# Patient Record
Sex: Female | Born: 1985 | Race: White | Hispanic: No | Marital: Married | Smoking: Current every day smoker
Health system: Southern US, Community
[De-identification: ages and names within clinical notes are randomized; demographics above are authoritative.]

## PROBLEM LIST (undated history)

## (undated) DIAGNOSIS — F32A Depression, unspecified: Secondary | ICD-10-CM

## (undated) DIAGNOSIS — F419 Anxiety disorder, unspecified: Secondary | ICD-10-CM

## (undated) DIAGNOSIS — E119 Type 2 diabetes mellitus without complications: Secondary | ICD-10-CM

## (undated) DIAGNOSIS — Z803 Family history of malignant neoplasm of breast: Secondary | ICD-10-CM

## (undated) DIAGNOSIS — M5430 Sciatica, unspecified side: Secondary | ICD-10-CM

## (undated) DIAGNOSIS — R569 Unspecified convulsions: Secondary | ICD-10-CM

## (undated) DIAGNOSIS — J45909 Unspecified asthma, uncomplicated: Secondary | ICD-10-CM

## (undated) DIAGNOSIS — F329 Major depressive disorder, single episode, unspecified: Secondary | ICD-10-CM

## (undated) HISTORY — DX: Depression, unspecified: F32.A

## (undated) HISTORY — DX: Unspecified convulsions: R56.9

## (undated) HISTORY — DX: Anxiety disorder, unspecified: F41.9

## (undated) HISTORY — DX: Sciatica, unspecified side: M54.30

## (undated) HISTORY — PX: WISDOM TOOTH EXTRACTION: SHX21

## (undated) HISTORY — DX: Major depressive disorder, single episode, unspecified: F32.9

## (undated) HISTORY — DX: Family history of malignant neoplasm of breast: Z80.3

---
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
## 2008-11-30 ENCOUNTER — Emergency Department: Payer: Self-pay | Admitting: Emergency Medicine

## 2009-01-13 ENCOUNTER — Emergency Department: Payer: Self-pay | Admitting: Emergency Medicine

## 2009-01-15 ENCOUNTER — Emergency Department: Payer: Self-pay | Admitting: Emergency Medicine

## 2009-02-21 ENCOUNTER — Emergency Department: Payer: Self-pay | Admitting: Emergency Medicine

## 2009-05-10 ENCOUNTER — Emergency Department: Payer: Self-pay | Admitting: Emergency Medicine

## 2010-06-09 ENCOUNTER — Emergency Department: Payer: Self-pay | Admitting: Emergency Medicine

## 2010-12-23 ENCOUNTER — Emergency Department: Payer: Self-pay | Admitting: Emergency Medicine

## 2013-04-04 LAB — COMPREHENSIVE METABOLIC PANEL
ALBUMIN: 3.9 g/dL (ref 3.4–5.0)
ALT: 12 U/L (ref 12–78)
ANION GAP: 6 — AB (ref 7–16)
Alkaline Phosphatase: 88 U/L
BUN: 11 mg/dL (ref 7–18)
Bilirubin,Total: 0.2 mg/dL (ref 0.2–1.0)
CHLORIDE: 108 mmol/L — AB (ref 98–107)
CREATININE: 0.76 mg/dL (ref 0.60–1.30)
Calcium, Total: 9 mg/dL (ref 8.5–10.1)
Co2: 21 mmol/L (ref 21–32)
EGFR (African American): >60
EGFR (Non-African Amer.): >60
Glucose: 88 mg/dL (ref 65–99)
OSMOLALITY: 269 (ref 275–301)
Potassium: 4 mmol/L (ref 3.5–5.1)
SGOT(AST): 27 U/L (ref 15–37)
Sodium: 135 mmol/L — ABNORMAL LOW (ref 136–145)
Total Protein: 7.4 g/dL (ref 6.4–8.2)

## 2013-04-04 LAB — SALICYLATE LEVEL: SALICYLATES, SERUM: 3.2 mg/dL — AB

## 2013-04-04 LAB — DRUG SCREEN, URINE

## 2013-04-04 LAB — CBC
HCT: 39.3 % (ref 35.0–47.0)
HGB: 13.3 g/dL (ref 12.0–16.0)
MCH: 29 pg (ref 26.0–34.0)
MCHC: 33.9 g/dL (ref 32.0–36.0)
MCV: 86 fL (ref 80–100)
Platelet: 249 10*3/uL (ref 150–440)
RBC: 4.6 10*6/uL (ref 3.80–5.20)
RDW: 13.5 % (ref 11.5–14.5)
WBC: 17.3 10*3/uL — AB (ref 3.6–11.0)

## 2013-04-04 LAB — URINALYSIS, COMPLETE
Bilirubin,UR: NEGATIVE
Blood: NEGATIVE
GLUCOSE, UR: NEGATIVE mg/dL (ref 0–75)
Ketone: NEGATIVE
Leukocyte Esterase: NEGATIVE
NITRITE: NEGATIVE
PH: 5 (ref 4.5–8.0)
Protein: NEGATIVE
RBC,UR: <1 /HPF (ref 0–5)
Specific Gravity: 1.008 (ref 1.003–1.030)
Squamous Epithelial: <1

## 2013-04-04 LAB — ETHANOL
Ethanol %: <0.003 % (ref 0.000–0.080)
Ethanol: <3 mg/dL

## 2013-04-04 LAB — ACETAMINOPHEN LEVEL: Acetaminophen: <2 ug/mL

## 2013-04-05 ENCOUNTER — Inpatient Hospital Stay: Payer: Self-pay | Admitting: Psychiatry

## 2013-04-05 LAB — PREGNANCY, URINE: PREGNANCY TEST, URINE: NEGATIVE m[IU]/mL

## 2013-04-15 ENCOUNTER — Emergency Department: Payer: Self-pay | Admitting: Emergency Medicine

## 2013-04-15 LAB — BASIC METABOLIC PANEL
Anion Gap: 5 — ABNORMAL LOW (ref 7–16)
BUN: 5 mg/dL — AB (ref 7–18)
CREATININE: 0.81 mg/dL (ref 0.60–1.30)
Calcium, Total: 9.7 mg/dL (ref 8.5–10.1)
Chloride: 109 mmol/L — ABNORMAL HIGH (ref 98–107)
Co2: 27 mmol/L (ref 21–32)
EGFR (Non-African Amer.): >60
Glucose: 94 mg/dL (ref 65–99)
OSMOLALITY: 278 (ref 275–301)
POTASSIUM: 4 mmol/L (ref 3.5–5.1)
Sodium: 141 mmol/L (ref 136–145)

## 2013-04-15 LAB — URINALYSIS, COMPLETE
BLOOD: NEGATIVE
Bilirubin,UR: NEGATIVE
Glucose,UR: NEGATIVE mg/dL (ref 0–75)
Ketone: NEGATIVE
Leukocyte Esterase: NEGATIVE
Nitrite: NEGATIVE
Ph: 7 (ref 4.5–8.0)
Protein: NEGATIVE
RBC, UR: NONE SEEN /HPF (ref 0–5)
SQUAMOUS EPITHELIAL: NONE SEEN
Specific Gravity: 1.003 (ref 1.003–1.030)
WBC UR: NONE SEEN /HPF (ref 0–5)

## 2013-04-15 LAB — CBC WITH DIFFERENTIAL/PLATELET
Basophil #: 0.1 10*3/uL (ref 0.0–0.1)
Basophil %: 0.7 %
Eosinophil #: 0.1 10*3/uL (ref 0.0–0.7)
Eosinophil %: 0.3 %
HCT: 44.5 % (ref 35.0–47.0)
HGB: 14.9 g/dL (ref 12.0–16.0)
Lymphocyte #: 2.9 10*3/uL (ref 1.0–3.6)
Lymphocyte %: 17.3 %
MCH: 28.7 pg (ref 26.0–34.0)
MCHC: 33.4 g/dL (ref 32.0–36.0)
MCV: 86 fL (ref 80–100)
Monocyte #: 0.7 x10 3/mm (ref 0.2–0.9)
Monocyte %: 3.9 %
Neutrophil #: 13.1 10*3/uL — ABNORMAL HIGH (ref 1.4–6.5)
Neutrophil %: 77.8 %
Platelet: 343 10*3/uL (ref 150–440)
RBC: 5.19 10*6/uL (ref 3.80–5.20)
RDW: 13.5 % (ref 11.5–14.5)
WBC: 16.8 10*3/uL — AB (ref 3.6–11.0)

## 2013-06-25 ENCOUNTER — Emergency Department: Payer: Self-pay | Admitting: Emergency Medicine

## 2013-09-16 ENCOUNTER — Emergency Department: Payer: Self-pay | Admitting: Emergency Medicine

## 2014-03-13 ENCOUNTER — Emergency Department: Payer: Self-pay | Admitting: Emergency Medicine

## 2014-03-13 LAB — COMPREHENSIVE METABOLIC PANEL
ALK PHOS: 59 U/L
Albumin: 4.1 g/dL (ref 3.4–5.0)
Anion Gap: 9 (ref 7–16)
BILIRUBIN TOTAL: 0.5 mg/dL (ref 0.2–1.0)
BUN: 11 mg/dL (ref 7–18)
CHLORIDE: 109 mmol/L — AB (ref 98–107)
CREATININE: 0.68 mg/dL (ref 0.60–1.30)
Calcium, Total: 8.9 mg/dL (ref 8.5–10.1)
Co2: 21 mmol/L (ref 21–32)
EGFR (African American): >60
EGFR (Non-African Amer.): >60
Glucose: 79 mg/dL (ref 65–99)
OSMOLALITY: 276 (ref 275–301)
Potassium: 3.8 mmol/L (ref 3.5–5.1)
SGOT(AST): 23 U/L (ref 15–37)
SGPT (ALT): 19 U/L
SODIUM: 139 mmol/L (ref 136–145)
Total Protein: 7.4 g/dL (ref 6.4–8.2)

## 2014-03-13 LAB — CBC
HCT: 41.8 % (ref 35.0–47.0)
HGB: 13.8 g/dL (ref 12.0–16.0)
MCH: 29.3 pg (ref 26.0–34.0)
MCHC: 33 g/dL (ref 32.0–36.0)
MCV: 89 fL (ref 80–100)
Platelet: 269 10*3/uL (ref 150–440)
RBC: 4.71 10*6/uL (ref 3.80–5.20)
RDW: 13.3 % (ref 11.5–14.5)
WBC: 10.9 10*3/uL (ref 3.6–11.0)

## 2014-05-09 DIAGNOSIS — R569 Unspecified convulsions: Secondary | ICD-10-CM | POA: Insufficient documentation
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
## 2014-06-24 NOTE — Discharge Summary (Signed)
PATIENT NAME:  Jenna Huber, Jenna Huber MR#:  [id number] DATE OF BIRTH:  April 23, 1985  DATE OF ADMISSION:  04/05/2013 DATE OF DISCHARGE:  04/07/2013  HOSPITAL COURSE: See dictated history and physical for details of admission. This 29 year old woman came into the Emergency Room after taking an overdose of ibuprofen. She was denying that it was suicidal, although she was agitated and had some vague suicidal thoughts when she first got in. She was very upset and agitated and out of control in the Emergency Room. When she got to the ward, she settled down quite a bit. Did not appear to be acutely psychotic. The patient appears to have an acute adjustment disorder, probably compounded by chronic dysthymia and anxiety and personality disorder features under severe stress. She was advised to try starting a serotonin reuptake inhibitor and has started on fluoxetine. Tolerated medicine well without any side effects. Did not engage in any dangerous behavior in the hospital. Insight and judgment improved. Participated in groups appropriately. Had a good visit with her family and now says that she plans not to go back to her wife right now. Thinks she will get more supportive care staying with her family. She is lucid with no signs of psychosis. Mental status exam back to normal. She is fully agreeable to outpatient treatment in the community. She will be discharged today with the plan to go back and stay with her family.   LABORATORY RESULTS: Admission labs include chemistry panel with an alcohol level negative, sodium low at 135, chloride elevated at 108. Drug screen negative. CBC: Elevated white count at 17.3, otherwise normal. Urinalysis 1+ bacteria but otherwise does not appear to be infected. Pregnancy test negative.   DISCHARGE MEDICATIONS: Fluoxetine 20 mg p.o. daily.   DISPOSITION: Discharge home with her family. [facility]   MENTAL STATUS EXAM AT DISCHARGE: Neatly dressed and groomed woman who looks her  stated age, cooperative with the interview. Good eye contact. Normal psychomotor activity. Speech normal rate, tone and volume. Affect euthymic, reactive, appropriate. Mood stated as being better. Thoughts are lucid. No loosening of associations. Denies auditory or visual hallucinations. Denied suicidal or homicidal ideation. Shows improved insight and judgment. Alert and oriented x 4. Short- and long-term memory grossly intact. Normal intelligence. Good judgment and insight.   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   SECONDARY DIAGNOSES:  AXIS I: Dysthymia.  AXIS II: Borderline personality disorder.  AXIS III: No diagnosis.  AXIS IV: Severe from break up with her wife.  AXIS V: Functioning at time of discharge 52.    ____________________________ Audery AmelJohn T. Jermain Curt, MD jtc:gb D: 04/07/2013 23:18:05 ET T: 04/08/2013 00:29:59 ET JOB#: [id number]  cc: Audery AmelJohn T. Sebrina Kessner, MD, <Dictator> Audery AmelJOHN T Jaspreet Bodner MD ELECTRONICALLY SIGNED 04/08/2013 10:21

## 2014-08-11 ENCOUNTER — Emergency Department
Admission: EM | Admit: 2014-08-11 | Discharge: 2014-08-11 | Disposition: A | Discharge status: Home / Self Care | Payer: Managed Care, Other (non HMO) | Attending: Emergency Medicine | Admitting: Emergency Medicine

## 2014-08-11 ENCOUNTER — Encounter: Payer: Self-pay | Admitting: Emergency Medicine

## 2014-08-11 ENCOUNTER — Emergency Department: Payer: Managed Care, Other (non HMO)

## 2014-08-11 DIAGNOSIS — M5441 Lumbago with sciatica, right side: Secondary | ICD-10-CM | POA: Diagnosis not present

## 2014-08-11 DIAGNOSIS — Z720 Tobacco use: Secondary | ICD-10-CM | POA: Diagnosis not present

## 2014-08-11 DIAGNOSIS — G8929 Other chronic pain: Secondary | ICD-10-CM | POA: Insufficient documentation

## 2014-08-11 DIAGNOSIS — M545 Low back pain, unspecified: Secondary | ICD-10-CM

## 2014-08-11 DIAGNOSIS — Z880 Allergy status to penicillin: Secondary | ICD-10-CM | POA: Insufficient documentation

## 2014-08-11 MED ORDER — PREDNISONE 10 MG PO TABS: ORAL_TABLET | ORAL | Status: DC

## 2014-08-11 NOTE — ED Notes (Signed)
Chronic back pain with ongoing sciatica, thinks she might have a cyst in her lower back as well.  Coming today because usually her back pain is managed well but lately it has been very painful.

## 2014-08-11 NOTE — ED Notes (Signed)
Has shooting pains down right leg, no loss of bowel or bladder control.  States now the pain is shooting up her back and this is something she is not used to.
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
## 2014-08-11 NOTE — Discharge Instructions (Signed)
Back Pain, Adult °Back pain is very common. The pain often gets better over time. The cause of back pain is usually not dangerous. Most people can learn to manage their back pain on their own.  °HOME CARE  °· Stay active. Start with short walks on flat ground if you can. Try to walk farther each day. °· Do not sit, drive, or stand in one place for more than 30 minutes. Do not stay in bed. °· Do not avoid exercise or work. Activity can help your back heal faster. °· Be careful when you bend or lift an object. Bend at your knees, keep the object close to you, and do not twist. °· Sleep on a firm mattress. Lie on your side, and bend your knees. If you lie on your back, put a pillow under your knees. °· Only take medicines as told by your doctor. °· Put ice on the injured area. °¨ Put ice in a plastic bag. °¨ Place a towel between your skin and the bag. °¨ Leave the ice on for 15-20 minutes, 03-04 times a day for the first 2 to 3 days. After that, you can switch between ice and heat packs. °· Ask your doctor about back exercises or massage. °· Avoid feeling anxious or stressed. Find good ways to deal with stress, such as exercise. °GET HELP RIGHT AWAY IF:  °· Your pain does not go away with rest or medicine. °· Your pain does not go away in 1 week. °· You have new problems. °· You do not feel well. °· The pain spreads into your legs. °· You cannot control when you poop (bowel movement) or pee (urinate). °· Your arms or legs feel weak or lose feeling (numbness). °· You feel sick to your stomach (nauseous) or throw up (vomit). °· You have belly (abdominal) pain. °· You feel like you may pass out (faint). °MAKE SURE YOU:  °· Understand these instructions. °· Will watch your condition. °· Will get help right away if you are not doing well or get worse. °Document Released: 08/06/2007 Document Revised: 05/12/2011 Document Reviewed: 06/21/2013 °[employer]® Patient Information ©2015 [employer]. This information is not intended  to replace advice given to you by your health care provider. Make sure you discuss any questions you have with your health care provider. ° °

## 2014-08-14 DIAGNOSIS — G43009 Migraine without aura, not intractable, without status migrainosus: Secondary | ICD-10-CM | POA: Insufficient documentation

## 2014-11-21 ENCOUNTER — Other Ambulatory Visit: Payer: Managed Care, Other (non HMO) | Admitting: Physical Medicine and Rehabilitation

## 2014-11-24 ENCOUNTER — Other Ambulatory Visit: Payer: Self-pay | Admitting: Physical Medicine and Rehabilitation

## 2014-11-24 DIAGNOSIS — M545 Low back pain, unspecified: Secondary | ICD-10-CM

## 2014-11-24 DIAGNOSIS — M79604 Pain in right leg: Secondary | ICD-10-CM

## 2014-12-05 ENCOUNTER — Ambulatory Visit
Admission: RE | Admit: 2014-12-05 | Discharge: 2014-12-05 | Disposition: A | Discharge status: Home / Self Care | Payer: Managed Care, Other (non HMO) | Source: Ambulatory Visit | Attending: Physical Medicine and Rehabilitation | Admitting: Physical Medicine and Rehabilitation

## 2014-12-05 DIAGNOSIS — M5136 Other intervertebral disc degeneration, lumbar region: Secondary | ICD-10-CM | POA: Insufficient documentation

## 2014-12-05 DIAGNOSIS — M545 Low back pain, unspecified: Secondary | ICD-10-CM

## 2014-12-05 DIAGNOSIS — M79604 Pain in right leg: Secondary | ICD-10-CM | POA: Diagnosis present

## 2015-05-31 ENCOUNTER — Other Ambulatory Visit: Payer: Self-pay | Admitting: Neurology

## 2015-05-31 DIAGNOSIS — R42 Dizziness and giddiness: Secondary | ICD-10-CM

## 2015-06-21 ENCOUNTER — Ambulatory Visit
Admission: RE | Admit: 2015-06-21 | Discharge: 2015-06-21 | Disposition: A | Discharge status: Home / Self Care | Payer: Managed Care, Other (non HMO) | Source: Ambulatory Visit | Attending: Neurology | Admitting: Neurology

## 2015-06-21 DIAGNOSIS — R42 Dizziness and giddiness: Secondary | ICD-10-CM | POA: Diagnosis present

## 2015-06-21 DIAGNOSIS — G43119 Migraine with aura, intractable, without status migrainosus: Secondary | ICD-10-CM | POA: Diagnosis present

## 2015-06-21 MED ORDER — GADOBENATE DIMEGLUMINE 529 MG/ML IV SOLN
15.0000 mL | Freq: Once | INTRAVENOUS | Status: AC | PRN
Start: 2015-06-21 — End: 2015-06-21
  Administered 2015-06-21: 13 mL via INTRAVENOUS

## 2015-08-29 ENCOUNTER — Emergency Department
Admission: EM | Admit: 2015-08-29 | Discharge: 2015-08-29 | Disposition: A | Discharge status: Home / Self Care | Payer: Managed Care, Other (non HMO) | Attending: Emergency Medicine | Admitting: Emergency Medicine

## 2015-08-29 DIAGNOSIS — J019 Acute sinusitis, unspecified: Secondary | ICD-10-CM | POA: Diagnosis not present

## 2015-08-29 DIAGNOSIS — Z7952 Long term (current) use of systemic steroids: Secondary | ICD-10-CM | POA: Diagnosis not present

## 2015-08-29 DIAGNOSIS — F17210 Nicotine dependence, cigarettes, uncomplicated: Secondary | ICD-10-CM | POA: Insufficient documentation

## 2015-08-29 DIAGNOSIS — T700XXA Otitic barotrauma, initial encounter: Secondary | ICD-10-CM

## 2015-08-29 DIAGNOSIS — R0981 Nasal congestion: Secondary | ICD-10-CM | POA: Diagnosis present

## 2015-08-29 MED ORDER — METHYLPREDNISOLONE 4 MG PO TBPK: ORAL_TABLET | ORAL | Status: DC

## 2015-08-29 MED ORDER — OXYCODONE-ACETAMINOPHEN 5-325 MG PO TABS
0.5000 | ORAL_TABLET | Freq: Once | ORAL | Status: AC
  Administered 2015-08-29: 0.5 via ORAL
  Filled 2015-08-29: qty 1

## 2015-08-29 MED ORDER — LEVOFLOXACIN 750 MG PO TABS: 750.0000 mg | ORAL_TABLET | Freq: Every day | ORAL | Status: DC

## 2015-08-29 MED ORDER — LEVOFLOXACIN 750 MG PO TABS
750.0000 mg | ORAL_TABLET | Freq: Once | ORAL | Status: AC
  Administered 2015-08-29: 750 mg via ORAL
  Filled 2015-08-29: qty 1
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
## 2015-08-29 NOTE — Discharge Instructions (Signed)
1. Take antibiotic as prescribed (Levaquin 750 mg daily 6 days). 2. Take steroid as prescribed (Medrol Dosepak). 3. Return to the ER for worsening symptoms, persistent vomiting, fever, difficulty breathing or other concerns.  Barotitis Media Barotitis media is inflammation of your middle ear. This occurs when the auditory tube (eustachian tube) leading from the back of your nose (nasopharynx) to your eardrum is blocked. This blockage may result from a cold, environmental allergies, or an upper respiratory infection. Unresolved barotitis media may lead to damage or hearing loss (barotrauma), which may become permanent. HOME CARE INSTRUCTIONS   Use medicines as recommended by your health care provider. Over-the-counter medicines will help unblock the canal and can help during times of air travel.  Do not put anything into your ears to clean or unplug them. Eardrops will not be helpful.  Do not swim, dive, or fly until your health care provider says it is all right to do so. If these activities are necessary, chewing gum with frequent, forceful swallowing may help. It is also helpful to hold your nose and gently blow to pop your ears for equalizing pressure changes. This forces air into the eustachian tube.  Only take over-the-counter or prescription medicines for pain, discomfort, or fever as directed by your health care provider.  A decongestant may be helpful in decongesting the middle ear and make pressure equalization easier. SEEK MEDICAL CARE IF:  You experience a serious form of dizziness in which you feel as if the room is spinning and you feel nauseated (vertigo).  Your symptoms only involve one ear. SEEK IMMEDIATE MEDICAL CARE IF:   You develop a severe headache, dizziness, or severe ear pain.  You have bloody or pus-like drainage from your ears.  You develop a fever.  Your problems do not improve or become worse. MAKE SURE YOU:   Understand these instructions.  Will watch  your condition.  Will get help right away if you are not doing well or get worse.   This information is not intended to replace advice given to you by your health care provider. Make sure you discuss any questions you have with your health care provider.   Document Released: 02/15/2000 Document Revised: 12/08/2012 Document Reviewed: 09/14/2012 Elsevier Interactive Patient Education 2016 [employer]  Sinusitis, Adult Sinusitis is redness, soreness, and puffiness (inflammation) of the air pockets in the bones of your face (sinuses). The redness, soreness, and puffiness can cause air and mucus to get trapped in your sinuses. This can allow germs to grow and cause an infection.  HOME CARE   Drink enough fluids to keep your pee (urine) clear or pale yellow.  Use a humidifier in your home.  Run a hot shower to create steam in the bathroom. Sit in the bathroom with the door closed. Breathe in the steam 3-4 times a day.  Put a warm, moist washcloth on your face 3-4 times a day, or as told by your doctor.  Use salt water sprays (saline sprays) to wet the thick fluid in your nose. This can help the sinuses drain.  Only take medicine as told by your doctor. GET HELP RIGHT AWAY IF:   Your pain gets worse.  You have very bad headaches.  You are sick to your stomach (nauseous).  You throw up (vomit).  You are very sleepy (drowsy) all the time.  Your face is puffy (swollen).  Your vision changes.  You have a stiff neck.  You have trouble breathing. MAKE SURE YOU:  Understand these instructions.  Will watch your condition.  Will get help right away if you are not doing well or get worse.   This information is not intended to replace advice given to you by your health care provider. Make sure you discuss any questions you have with your health care provider.   Document Released: 08/06/2007 Document Revised: 03/10/2014 Document Reviewed: 09/23/2011 Elsevier Interactive Patient  Education [employer] Inc[employer]

## 2015-08-29 NOTE — ED Notes (Signed)
Pt in with co right sided facial swelling and pressure. Has had congestion and right sided earache for a week.

## 2015-08-29 NOTE — ED Provider Notes (Signed)
[facility] Emergency Department Provider Note   ____________________________________________  Time seen: Approximately 4:52 AM  I have reviewed the triage vital signs and the nursing notes.   HISTORY  Chief Complaint Facial Swelling    HPI Jenna Huber is a [age] y.o. female who presents to the ED from home with a chief complaint of sinus congestion/pressure, right ear pain/fullness and right sided facial pressure; feels like the right side of her face and nasal bridge or swollen. Symptoms ongoing x 1 week. Denies associated fever, chills, cough, congestion, chest pain, shortness of breath, abdominal pain, nausea, vomiting, diarrhea. Denies recent travel or trauma. Nothing makes her symptoms better or worse.   Past medical history None  There are no active problems to display for this patient.   Past Surgical History  Procedure Laterality Date  . Wisdom tooth extraction      Current Outpatient Rx  Name  Route  Sig  Dispense  Refill  . levofloxacin (LEVAQUIN) 750 MG tablet   Oral   Take 1 tablet (750 mg total) by mouth daily.   6 tablet   0   . methylPREDNISolone (MEDROL DOSEPAK) 4 MG TBPK tablet      Take as directed   21 tablet   0   . predniSONE (DELTASONE) 10 MG tablet      Take 6 tablets  today, on day 2 take 5 tablets, day 3 take 4 tablets, day 4 take 3 tablets, day 5 take  2 tablets and 1 tablet the last day   21 tablet   0     Allergies Penicillins and Vicodin  No family history on file.  Social History Social History  Substance Use Topics  . Smoking status: Current Every Day Smoker -- 0.50 packs/day for 3 years    Types: Cigarettes  . Smokeless tobacco: Not on file  . Alcohol Use: Yes     Comment: rarely    Review of Systems  Constitutional: No fever/chills. Eyes: No visual changes. ENT: Positive for sinus pressure, right ear discomfort and nasal congestion. No sore throat. Cardiovascular: Denies chest  pain. Respiratory: Denies shortness of breath. Gastrointestinal: No abdominal pain.  No nausea, no vomiting.  No diarrhea.  No constipation. Genitourinary: Negative for dysuria. Musculoskeletal: Negative for back pain. Skin: Negative for rash. Neurological: Negative for headaches, focal weakness or numbness.  10-point ROS otherwise negative.  ____________________________________________   PHYSICAL EXAM:  VITAL SIGNS: ED Triage Vitals  Enc Vitals Group     BP 08/29/15 0203 117/65 mmHg     Pulse Rate 08/29/15 0203 103     Resp 08/29/15 0203 18     Temp 08/29/15 0203 98.1 F (36.7 C)     Temp Source 08/29/15 0203 Oral     SpO2 08/29/15 0203 100 %     Weight 08/29/15 0203 145 lb (65.772 kg)     Height 08/29/15 0203 5\' 4"  (1.626 m)     Head Cir --      Peak Flow --      Pain Score 08/29/15 0203 10     Pain Loc --      Pain Edu? --      Excl. in GC? --     Constitutional: Alert and oriented. Well appearing and in no acute distress. Eyes: Conjunctivae are normal. PERRL. EOMI. Head: Atraumatic. No visible facial swelling. Frontal maxillary sinuses tender to palpation. Ears: Right TM with fluid. Left TM within normal limits. Nose: Congestion/rhinnorhea.  Mouth/Throat: Mucous membranes are moist.  Oropharynx non-erythematous.  Postnasal drip. Neck: No stridor.   Hematological/Lymphatic/Immunilogical: No cervical lymphadenopathy. Cardiovascular: Normal rate, regular rhythm. Grossly normal heart sounds.  Good peripheral circulation. Respiratory: Normal respiratory effort.  No retractions. Lungs CTAB. Gastrointestinal: Soft and nontender. No distention. No abdominal bruits. No CVA tenderness. Musculoskeletal: No lower extremity tenderness nor edema.  No joint effusions. Neurologic:  Normal speech and language. No gross focal neurologic deficits are appreciated. No gait instability. Skin:  Skin is warm, dry and intact. No rash noted. Psychiatric: Mood and affect are normal. Speech  and behavior are normal.  ____________________________________________   LABS (all labs ordered are listed, but only abnormal results are displayed)  Labs Reviewed - No data to display ____________________________________________  EKG  None ____________________________________________  RADIOLOGY  None ____________________________________________   PROCEDURES  Procedure(s) performed: None  Critical Care performed: No  ____________________________________________   INITIAL IMPRESSION / ASSESSMENT AND PLAN / ED COURSE  Pertinent labs & imaging results that were available during my care of the patient were reviewed by me and considered in my medical decision making (see chart for details).  30 year old female who presents with sinus pressure and right ear discomfort consistent with sinusitis with Eustachian tube dysfunction. Will treat with Levaquin, Medrol Dosepak and patient will follow-up with her PCP next week. Strict return precautions given. Patient verbalizes understanding and agrees with plan of care. ____________________________________________   FINAL CLINICAL IMPRESSION(S) / ED DIAGNOSES  Final diagnoses:  Acute sinusitis, recurrence not specified, unspecified location  Barotitis media, initial encounter      NEW MEDICATIONS STARTED DURING THIS VISIT:  New Prescriptions   LEVOFLOXACIN (LEVAQUIN) 750 MG TABLET    Take 1 tablet (750 mg total) by mouth daily.   METHYLPREDNISOLONE (MEDROL DOSEPAK) 4 MG TBPK TABLET    Take as directed     Note:  This document was prepared using Dragon voice recognition software and may include unintentional dictation errors.    Irean HongJade J Damali Broadfoot, MD 08/29/15 [id number]

## 2015-10-12 ENCOUNTER — Encounter: Payer: Self-pay | Admitting: Emergency Medicine

## 2015-10-12 ENCOUNTER — Emergency Department
Admission: EM | Admit: 2015-10-12 | Discharge: 2015-10-12 | Disposition: A | Discharge status: Home / Self Care | Payer: Managed Care, Other (non HMO) | Attending: Emergency Medicine | Admitting: Emergency Medicine

## 2015-10-12 ENCOUNTER — Emergency Department: Payer: Managed Care, Other (non HMO)

## 2015-10-12 DIAGNOSIS — N83209 Unspecified ovarian cyst, unspecified side: Secondary | ICD-10-CM | POA: Insufficient documentation

## 2015-10-12 DIAGNOSIS — N760 Acute vaginitis: Secondary | ICD-10-CM | POA: Insufficient documentation

## 2015-10-12 DIAGNOSIS — R1031 Right lower quadrant pain: Secondary | ICD-10-CM | POA: Diagnosis present

## 2015-10-12 DIAGNOSIS — Z7951 Long term (current) use of inhaled steroids: Secondary | ICD-10-CM | POA: Diagnosis not present

## 2015-10-12 DIAGNOSIS — F17210 Nicotine dependence, cigarettes, uncomplicated: Secondary | ICD-10-CM | POA: Diagnosis not present

## 2015-10-12 DIAGNOSIS — Z7982 Long term (current) use of aspirin: Secondary | ICD-10-CM | POA: Insufficient documentation

## 2015-10-12 DIAGNOSIS — B9689 Other specified bacterial agents as the cause of diseases classified elsewhere: Secondary | ICD-10-CM

## 2015-10-12 LAB — POCT PREGNANCY, URINE: Preg Test, Ur: NEGATIVE

## 2015-10-12 LAB — URINALYSIS COMPLETE WITH MICROSCOPIC (ARMC ONLY)
Bacteria, UA: NONE SEEN
Bilirubin Urine: NEGATIVE
Glucose, UA: NEGATIVE mg/dL
LEUKOCYTES UA: NEGATIVE
NITRITE: NEGATIVE
PH: 5 (ref 5.0–8.0)
PROTEIN: NEGATIVE mg/dL
RBC / HPF: NONE SEEN RBC/hpf (ref 0–5)
SPECIFIC GRAVITY, URINE: 1.011 (ref 1.005–1.030)
WBC, UA: NONE SEEN WBC/hpf (ref 0–5)

## 2015-10-12 LAB — COMPREHENSIVE METABOLIC PANEL
ALT: 9 U/L — AB (ref 14–54)
AST: 21 U/L (ref 15–41)
Albumin: 5.1 g/dL — ABNORMAL HIGH (ref 3.5–5.0)
Alkaline Phosphatase: 51 U/L (ref 38–126)
Anion gap: 9 (ref 5–15)
BILIRUBIN TOTAL: 1 mg/dL (ref 0.3–1.2)
BUN: 8 mg/dL (ref 6–20)
CALCIUM: 9.7 mg/dL (ref 8.9–10.3)
CHLORIDE: 105 mmol/L (ref 101–111)
CO2: 24 mmol/L (ref 22–32)
CREATININE: 0.59 mg/dL (ref 0.44–1.00)
Glucose, Bld: 123 mg/dL — ABNORMAL HIGH (ref 65–99)
Potassium: 3.9 mmol/L (ref 3.5–5.1)
Sodium: 138 mmol/L (ref 135–145)
TOTAL PROTEIN: 8 g/dL (ref 6.5–8.1)

## 2015-10-12 LAB — CBC
HCT: 40.8 % (ref 35.0–47.0)
Hemoglobin: 14.1 g/dL (ref 12.0–16.0)
MCH: 30 pg (ref 26.0–34.0)
MCHC: 34.6 g/dL (ref 32.0–36.0)
MCV: 86.9 fL (ref 80.0–100.0)
PLATELETS: 279 10*3/uL (ref 150–440)
RBC: 4.7 MIL/uL (ref 3.80–5.20)
RDW: 13.9 % (ref 11.5–14.5)
WBC: 19.7 10*3/uL — AB (ref 3.6–11.0)

## 2015-10-12 LAB — CHLAMYDIA/NGC RT PCR (ARMC ONLY)
CHLAMYDIA TR: NOT DETECTED
N GONORRHOEAE: NOT DETECTED

## 2015-10-12 LAB — WET PREP, GENITAL
Sperm: NONE SEEN
Trich, Wet Prep: NONE SEEN
Yeast Wet Prep HPF POC: NONE SEEN

## 2015-10-12 LAB — LIPASE, BLOOD: LIPASE: 18 U/L (ref 11–51)

## 2015-10-12 MED ORDER — IOPAMIDOL (ISOVUE-300) INJECTION 61%
100.0000 mL | Freq: Once | INTRAVENOUS | Status: AC | PRN
  Administered 2015-10-12: 100 mL via INTRAVENOUS

## 2015-10-12 MED ORDER — OXYCODONE-ACETAMINOPHEN 5-325 MG PO TABS: 0.5000 | ORAL_TABLET | ORAL | 0 refills | Status: DC | PRN

## 2015-10-12 MED ORDER — SODIUM CHLORIDE 0.9 % IV BOLUS (SEPSIS)
1000.0000 mL | Freq: Once | INTRAVENOUS | Status: AC
  Administered 2015-10-12: 1000 mL via INTRAVENOUS

## 2015-10-12 MED ORDER — METRONIDAZOLE 500 MG PO TABS: 500.0000 mg | ORAL_TABLET | Freq: Two times a day (BID) | ORAL | 0 refills | Status: DC

## 2015-10-12 MED ORDER — DIATRIZOATE MEGLUMINE & SODIUM 66-10 % PO SOLN
15.0000 mL | Freq: Once | ORAL | Status: AC
  Administered 2015-10-12: 15 mL via ORAL

## 2015-10-12 MED ORDER — HYDROMORPHONE HCL 1 MG/ML IJ SOLN
0.5000 mg | INTRAMUSCULAR | Status: AC
  Administered 2015-10-12: 0.5 mg via INTRAVENOUS

## 2015-10-12 MED ORDER — HYDROMORPHONE HCL 1 MG/ML IJ SOLN
0.5000 mg | INTRAMUSCULAR | Status: AC
  Administered 2015-10-12: 0.5 mg via INTRAVENOUS
  Filled 2015-10-12: qty 1

## 2015-10-12 MED ORDER — ONDANSETRON 4 MG PO TBDP: 4.0000 mg | ORAL_TABLET | Freq: Four times a day (QID) | ORAL | 0 refills | Status: DC | PRN

## 2015-10-12 MED ORDER — ONDANSETRON HCL 4 MG/2ML IJ SOLN
4.0000 mg | Freq: Once | INTRAMUSCULAR | Status: AC
  Administered 2015-10-12: 4 mg via INTRAVENOUS
  Filled 2015-10-12: qty 2

## 2015-10-12 NOTE — ED Triage Notes (Signed)
Pt reports lower abdominal pressure that started yesterday; reports pain is worse with movement and cough. Pt reports nausea, vomiting and diarrhea.

## 2015-10-12 NOTE — Discharge Instructions (Signed)
You were seen in the emergency room for abdominal pain and it appears this is from a cyst that broke open on your ovary. It is important that you follow up closely with gynecology (Dr. Valentino Saxonherry) your primary care doctor in the next couple of days.  Please return to the emergency room right away if you are to develop a fever, severe nausea, your pain becomes severe or worsens, you are unable to keep food down, begin vomiting any dark or bloody fluid, you develop any dark or bloody stools, feel dehydrated, or other new concerns or symptoms arise.

## 2015-10-12 NOTE — ED Provider Notes (Signed)
[facility] Emergency Department Provider Note   ____________________________________________   First MD Initiated Contact with Patient 10/12/15 0719     (approximate)  I have reviewed the triage vital signs and the nursing notes.   HISTORY  Chief Complaint Abdominal Pain    HPI Jenna Huber is a 30 y.o. female reports previously healthy. Last night she began experiencing increasing lower abdominal pain, associated with 3 loose stools during the evening and vomiting once. She now reports severe pain in the lower abdomen, made worse with any type of movement or coughing. She reports she coughed once and had severe pain in the lower to mid abdomen. Also reports a lot of pain around her belly button.  No vaginal discharge or recent bleeding.  She is not sexually active with any males.   History reviewed. No pertinent past medical history.  There are no active problems to display for this patient.   Past Surgical History:  Procedure Laterality Date  . WISDOM TOOTH EXTRACTION      Prior to Admission medications   Medication Sig Start Date End Date Taking? Authorizing Provider  acetaminophen (TYLENOL) 325 MG tablet Take 650 mg by mouth every 6 (six) hours as needed.   Yes Historical Provider, MD  albuterol (PROVENTIL HFA;VENTOLIN HFA) 108 (90 Base) MCG/ACT inhaler Inhale 2 puffs into the lungs every 6 (six) hours as needed.   Yes Historical Provider, MD  aspirin-acetaminophen-caffeine (EXCEDRIN MIGRAINE) [phone] MG tablet Take 1 tablet by mouth every 6 (six) hours as needed for headache.   Yes Historical Provider, MD  citalopram (CELEXA) 40 MG tablet Take 40 mg by mouth daily.   Yes Historical Provider, MD  montelukast (SINGULAIR) 10 MG tablet Take 10 mg by mouth at bedtime.   Yes Historical Provider, MD  metroNIDAZOLE (FLAGYL) 500 MG tablet Take 1 tablet (500 mg total) by mouth 2 (two) times daily. 10/12/15   Sharyn CreamerMark Orva Gwaltney, MD  ondansetron  (ZOFRAN ODT) 4 MG disintegrating tablet Take 1 tablet (4 mg total) by mouth every 6 (six) hours as needed for nausea or vomiting. 10/12/15   Sharyn CreamerMark Nayana Lenig, MD  oxyCODONE-acetaminophen (ROXICET) 5-325 MG tablet Take 0.5 tablets by mouth every 4 (four) hours as needed for moderate pain or severe pain. 10/12/15   Sharyn CreamerMark Yehudit Fulginiti, MD    Allergies Penicillins and Vicodin [hydrocodone-acetaminophen]  No family history on file.  Social History Social History  Substance Use Topics  . Smoking status: Current Every Day Smoker    Packs/day: 0.50    Years: 3.00    Types: Cigarettes  . Smokeless tobacco: Never Used  . Alcohol use Yes     Comment: rarely    Review of Systems Constitutional: No fever/chills, but feels fatigued. No travel history. Eyes: No visual changes. ENT: No sore throat. Cardiovascular: Denies chest pain. Respiratory: Denies shortness of breath. Gastrointestinal:   No constipation. Genitourinary: Negative for dysuria. Musculoskeletal: Negative for back pain. Skin: Negative for rash. Neurological: Negative for headaches, focal weakness or numbness.  10-point ROS otherwise negative.  ____________________________________________   PHYSICAL EXAM:  VITAL SIGNS: ED Triage Vitals [10/12/15 0712]  Enc Vitals Group     BP 119/62     Pulse Rate 89     Resp 15     Temp 98.2 F (36.8 C)     Temp Source Oral     SpO2 99 %     Weight 140 lb (63.5 kg)     Height 5\' 4"  (1.626 m)  Head Circumference      Peak Flow      Pain Score 10     Pain Loc      Pain Edu?      Excl. in GC?     Constitutional: Alert and oriented. Well appearing But sitting upright, and does look to be in pain Eyes: Conjunctivae are normal. PERRL. EOMI. Head: Atraumatic. Nose: No congestion/rhinnorhea. Mouth/Throat: Mucous membranes are dry.  Oropharynx non-erythematous. Neck: No stridor.   Cardiovascular: Normal rate, regular rhythm. Grossly normal heart sounds.  Good peripheral  circulation. Respiratory: Normal respiratory effort.  No retractions. Lungs CTAB. Gastrointestinal: Soft and exquisitely tender in the right lower abdomen, with positive Rovsing. Also some mild periumbilical tenderness. Pain to percussion noted over the right lower quadrant.. No distention. No abdominal bruits. No CVA tenderness. Musculoskeletal: No lower extremity tenderness nor edema.  Neurologic:  Normal speech and language. No gross focal neurologic deficits are appreciated.  Skin:  Skin is warm, dry and intact. No rash noted. Psychiatric: Mood and affect are normal. Speech and behavior are normal.  Genitourinary exam performed with RN at bedside. Normal external and internal examination except for some mild thin white non-purulent appearing discharge. No cervical motion tenderness. Moderate left adnexal discomfort. ____________________________________________   LABS (all labs ordered are listed, but only abnormal results are displayed)  Labs Reviewed  WET PREP, GENITAL - Abnormal; Notable for the following:       Result Value   Clue Cells Wet Prep HPF POC PRESENT (*)    WBC, Wet Prep HPF POC FEW (*)    All other components within normal limits  COMPREHENSIVE METABOLIC PANEL - Abnormal; Notable for the following:    Glucose, Bld 123 (*)    Albumin 5.1 (*)    ALT 9 (*)    All other components within normal limits  CBC - Abnormal; Notable for the following:    WBC 19.7 (*)    All other components within normal limits  URINALYSIS COMPLETEWITH MICROSCOPIC ([facility] ONLY) - Abnormal; Notable for the following:    Color, Urine YELLOW (*)    APPearance CLOUDY (*)    Ketones, ur 1+ (*)    Hgb urine dipstick 1+ (*)    Squamous Epithelial / LPF 0-5 (*)    All other components within normal limits  CHLAMYDIA/NGC RT PCR ([facility] ONLY)  LIPASE, BLOOD  POC URINE PREG, ED  POCT PREGNANCY, URINE    ____________________________________________  EKG   ____________________________________________  RADIOLOGY  US Transvaginal Non-ob  Result Date: 10/12/2015 CLINICAL DATA:  Right lower quadrant abdominal pain for 24 hours. EXAM: TRANSABDOMINAL AND TRANSVAGINAL ULTRASOUND OF PELVIS DOPPLER ULTRASOUND OF OVARIES TECHNIQUE: Both transabdominal and transvaginal ultrasound examinations of the pelvis were performed. Transabdominal technique was performed for global imaging of the pelvis including uterus, ovaries, adnexal regions, and pelvic cul-de-sac. It was necessary to proceed with endovaginal exam following the transabdominal exam to visualize the ovaries and endometrium. Color and duplex Doppler ultrasound was utilized to evaluate blood flow to the ovaries. COMPARISON:  None. FINDINGS: Uterus Measurements: 6.6 x 2.8 x 5.0 cm. No fibroids or other mass visualized. Endometrium Thickness: 10 mm.  No focal abnormality visualized. Right ovary Measurements: 2.6 x 2.0 x 2.0 cm. Normal appearance/no adnexal mass. Left ovary Measurements: 2.7 x 3.0 x 3.2 cm. Normal appearance/no adnexal mass. Pulsed Doppler evaluation of both ovaries demonstrates normal low-resistance arterial and venous waveforms. Other findings Small amount of free pelvic fluid noted in the cul-de-sac and area adnexa.  A ruptured cyst is possible. IMPRESSION: Normal sonographic appearance of the uterus and both ovaries. Small amount of free pelvic fluid could be physiologic or due to a ruptured cyst. Electronically Signed   By: Rudie Meyer M.D.   On: 10/12/2015 09:09   US Pelvis Complete  Result Date: 10/12/2015 CLINICAL DATA:  Right lower quadrant abdominal pain for 24 hours. EXAM: TRANSABDOMINAL AND TRANSVAGINAL ULTRASOUND OF PELVIS DOPPLER ULTRASOUND OF OVARIES TECHNIQUE: Both transabdominal and transvaginal ultrasound examinations of the pelvis were performed. Transabdominal technique was performed for global imaging of the pelvis  including uterus, ovaries, adnexal regions, and pelvic cul-de-sac. It was necessary to proceed with endovaginal exam following the transabdominal exam to visualize the ovaries and endometrium. Color and duplex Doppler ultrasound was utilized to evaluate blood flow to the ovaries. COMPARISON:  None. FINDINGS: Uterus Measurements: 6.6 x 2.8 x 5.0 cm. No fibroids or other mass visualized. Endometrium Thickness: 10 mm.  No focal abnormality visualized. Right ovary Measurements: 2.6 x 2.0 x 2.0 cm. Normal appearance/no adnexal mass. Left ovary Measurements: 2.7 x 3.0 x 3.2 cm. Normal appearance/no adnexal mass. Pulsed Doppler evaluation of both ovaries demonstrates normal low-resistance arterial and venous waveforms. Other findings Small amount of free pelvic fluid noted in the cul-de-sac and area adnexa. A ruptured cyst is possible. IMPRESSION: Normal sonographic appearance of the uterus and both ovaries. Small amount of free pelvic fluid could be physiologic or due to a ruptured cyst. Electronically Signed   By: Rudie Meyer M.D.   On: 10/12/2015 09:09   Ct Abdomen Pelvis W Contrast  Result Date: 10/12/2015 CLINICAL DATA:  30 year old female with a history of right lower quadrant pain for 12 hours EXAM: CT ABDOMEN AND PELVIS WITH CONTRAST TECHNIQUE: Multidetector CT imaging of the abdomen and pelvis was performed using the standard protocol following bolus administration of intravenous contrast. CONTRAST:  ISOVUE-300 IOPAMIDOL (ISOVUE-300) INJECTION 61% COMPARISON:  CT 11/30/2008, ultrasound 10/12/2015 FINDINGS: Lower chest: Unremarkable appearance of the soft tissues of the chest wall. Heart size within normal limits.  No pericardial fluid/thickening. No lower mediastinal adenopathy. Unremarkable appearance of the distal esophagus. No hiatal hernia. No confluent airspace disease, pleural fluid, or pneumothorax within visualized lung. Abdomen/pelvis: Unremarkable appearance of spleen. Focal fatty  infiltration of the falciform ligament with otherwise unremarkable liver peer Unremarkable appearance of bilateral adrenal glands. No peripancreatic or pericholecystic fluid or inflammatory changes. No radio-opaque gallstones. No intrahepatic or extrahepatic biliary ductal dilatation. No intra-peritoneal free air or significant free-fluid. No abnormally dilated small bowel or colon. No transition point. No inflammatory changes of the mesenteries. Normal appendix identified. No diverticular disease. Right Kidney/Ureter: No hydronephrosis. No nephrolithiasis. No perinephric stranding. Unremarkable course of the right ureter. Left Kidney/Ureter: No hydronephrosis. No nephrolithiasis. No perinephric stranding. Unremarkable course of the left ureter. Unremarkable appearance of the urinary bladder. Small amount of fluid within the endometrial canal. Crenulated, rim enhancing structure of the left adnexa measures 2.9 cm with adjacent intermediate density fluid, with the greatest diameter of the fluid 5.0 cm. Trace fluid within the right anatomic pelvis. Unremarkable appearance of the right adnexa. No significant vascular calcification. No aneurysm or periaortic fluid identified. Musculoskeletal: No displaced fracture identified. No significant degenerative changes of the spine. IMPRESSION: Cystic structure of the left adnexa measures 2.4 cm with adjacent intermediate density fluid, and trace free fluid in the right pelvis. Findings are most compatible with ruptured hemorrhagic cyst or physiologic fluid given the patient's age. Signed, Yvone Neu. Loreta Ave, DO [facility] Radiology Electronically Signed   By: Gilmer Mor D.O.   On: 10/12/2015 09:29   [place] Art/ven Flow Abd Pelv Doppler  Result Date: 10/12/2015 CLINICAL DATA:  Right lower quadrant abdominal pain for 24 hours. EXAM: TRANSABDOMINAL AND TRANSVAGINAL ULTRASOUND OF PELVIS DOPPLER ULTRASOUND OF OVARIES TECHNIQUE: Both  transabdominal and transvaginal ultrasound examinations of the pelvis were performed. Transabdominal technique was performed for global imaging of the pelvis including uterus, ovaries, adnexal regions, and pelvic cul-de-sac. It was necessary to proceed with endovaginal exam following the transabdominal exam to visualize the ovaries and endometrium. Color and duplex Doppler ultrasound was utilized to evaluate blood flow to the ovaries. COMPARISON:  None. FINDINGS: Uterus Measurements: 6.6 x 2.8 x 5.0 cm. No fibroids or other mass visualized. Endometrium Thickness: 10 mm.  No focal abnormality visualized. Right ovary Measurements: 2.6 x 2.0 x 2.0 cm. Normal appearance/no adnexal mass. Left ovary Measurements: 2.7 x 3.0 x 3.2 cm. Normal appearance/no adnexal mass. Pulsed Doppler evaluation of both ovaries demonstrates normal low-resistance arterial and venous waveforms. Other findings Small amount of free pelvic fluid noted in the cul-de-sac and area adnexa. A ruptured cyst is possible. IMPRESSION: Normal sonographic appearance of the uterus and both ovaries. Small amount of free pelvic fluid could be physiologic or due to a ruptured cyst. Electronically Signed   By: Rudie Meyer M.D.   On: 10/12/2015 09:09    ____________________________________________   PROCEDURES  Procedure(s) performed: None  Procedures  Critical Care performed: No  ____________________________________________   INITIAL IMPRESSION / ASSESSMENT AND PLAN / ED COURSE  Pertinent labs & imaging results that were available during my care of the patient were reviewed by me and considered in my medical decision making (see chart for details).  Differential diagnosis includes but is not limited to, abdominal perforation, aortic dissection, cholecystitis, appendicitis, diverticulitis, colitis, esophagitis/gastritis, kidney stone, pyelonephritis, urinary tract infection, aortic aneurysm. All are considered in decision and treatment  plan. Based upon the patient's presentation and risk factors, I am very concerned about possible appendicitis as the patient exhibits localized peritonitis primarily in the right lower quadrant. We'll proceed initially with CT of the abdomen and pelvis, also the patient denies any gynecologic symptoms and I feel the likelihood for ovarian torsion is somewhat less likely, however certainly consideration is still given.  Discussed risks and benefits of pain control medications with the patient. We're agreeable to trialing Dilaudid for pain at this time, she reports itching with Vicodin but has never had any issues with Percocet. She does not believe she ever had morphine in the past.   Clinical Course    ----------------------------------------- 12:54 PM on 10/12/2015 -----------------------------------------  Patient reports her pain is much better. On repeat exam pain is well-controlled, minimal tenderness suprapubically but no ongoing evidence of severe pain or peritonitis. Appears consistent with a ruptured ovarian cyst without obvious complication.  Case care clinical history and imaging reviewed with Dr. Valentino Saxon of gynecology. Advises discharge with outpatient follow-up. Discussed with the patient who is agreeable with the plan, and I believe she is appropriate for outpatient.  I will prescribe the patient a narcotic pain medicine due to their condition which I anticipate will cause at least moderate pain short term. I discussed with the patient safe use of narcotic pain medicines, and that they are not to drive, work in dangerous areas, or ever take more than prescribed (no more than 1 pill every 6 hours). We discussed that this is the type of medication that can  be  overdosed on and the risks of this type of medicine. Patient is very agreeable to only use as prescribed and to never use more than prescribed.  ____________________________________________   FINAL CLINICAL IMPRESSION(S) / ED  DIAGNOSES  Final diagnoses:  BV (bacterial vaginosis)  Ruptured ovarian cyst      NEW MEDICATIONS STARTED DURING THIS VISIT:  New Prescriptions   METRONIDAZOLE (FLAGYL) 500 MG TABLET    Take 1 tablet (500 mg total) by mouth 2 (two) times daily.   ONDANSETRON (ZOFRAN ODT) 4 MG DISINTEGRATING TABLET    Take 1 tablet (4 mg total) by mouth every 6 (six) hours as needed for nausea or vomiting.   OXYCODONE-ACETAMINOPHEN (ROXICET) 5-325 MG TABLET    Take 0.5 tablets by mouth every 4 (four) hours as needed for moderate pain or severe pain.     Note:  This document was prepared using Dragon voice recognition software and may include unintentional dictation errors.     Sharyn Creamer, MD 10/12/15 1255

## 2018-02-13 ENCOUNTER — Other Ambulatory Visit: Payer: Self-pay

## 2018-02-13 ENCOUNTER — Emergency Department
Admission: EM | Admit: 2018-02-13 | Discharge: 2018-02-13 | Disposition: A | Discharge status: Home / Self Care | Payer: Managed Care, Other (non HMO) | Attending: Emergency Medicine | Admitting: Emergency Medicine

## 2018-02-13 DIAGNOSIS — Z79899 Other long term (current) drug therapy: Secondary | ICD-10-CM | POA: Diagnosis not present

## 2018-02-13 DIAGNOSIS — F17210 Nicotine dependence, cigarettes, uncomplicated: Secondary | ICD-10-CM | POA: Diagnosis not present

## 2018-02-13 DIAGNOSIS — R002 Palpitations: Secondary | ICD-10-CM | POA: Diagnosis not present

## 2018-02-13 DIAGNOSIS — R202 Paresthesia of skin: Secondary | ICD-10-CM | POA: Diagnosis present

## 2018-02-13 LAB — URINALYSIS, COMPLETE (UACMP) WITH MICROSCOPIC
BILIRUBIN URINE: NEGATIVE
GLUCOSE, UA: 50 mg/dL — AB
HGB URINE DIPSTICK: NEGATIVE
Ketones, ur: NEGATIVE mg/dL
NITRITE: NEGATIVE
PH: 7 (ref 5.0–8.0)
Protein, ur: NEGATIVE mg/dL
SPECIFIC GRAVITY, URINE: 1.008 (ref 1.005–1.030)

## 2018-02-13 LAB — BASIC METABOLIC PANEL
ANION GAP: 6 (ref 5–15)
BUN: 6 mg/dL (ref 6–20)
CALCIUM: 8.9 mg/dL (ref 8.9–10.3)
CO2: 22 mmol/L (ref 22–32)
Chloride: 108 mmol/L (ref 98–111)
Creatinine, Ser: 0.64 mg/dL (ref 0.44–1.00)
GFR calc Af Amer: >60 mL/min (ref >60)
Glucose, Bld: 91 mg/dL (ref 70–99)
Potassium: 4.1 mmol/L (ref 3.5–5.1)
Sodium: 136 mmol/L (ref 135–145)

## 2018-02-13 LAB — GLUCOSE, CAPILLARY: Glucose-Capillary: 87 mg/dL (ref 70–99)

## 2018-02-13 LAB — CBC
HCT: 40.3 % (ref 36.0–46.0)
Hemoglobin: 13.1 g/dL (ref 12.0–15.0)
MCH: 28.6 pg (ref 26.0–34.0)
MCHC: 32.5 g/dL (ref 30.0–36.0)
MCV: 88 fL (ref 80.0–100.0)
NRBC: 0 % (ref 0.0–0.2)
PLATELETS: 312 10*3/uL (ref 150–400)
RBC: 4.58 MIL/uL (ref 3.87–5.11)
RDW: 13.1 % (ref 11.5–15.5)
WBC: 12.1 10*3/uL — AB (ref 4.0–10.5)

## 2018-02-13 NOTE — ED Triage Notes (Addendum)
Pt comes via POV from home with c/o possible hypoglycemia. Pt states this has been ongoing for about 2 weeks and states she doesn't know why her sugar is low.  Pt states headache, shaky and unsure what the problem is. Pt also states sleepiness.  Pt states last sugar check was by friend about 30 minutes ago and was 85. Pt states she was advised to drink orange juice and eat some toast.  Pt denies any hx of diabetes.  Pt appears in NAD at this time. BS checked and is [id number]

## 2018-02-13 NOTE — ED Provider Notes (Signed)
[facility] Emergency Department Provider Note   ____________________________________________    I have reviewed the triage vital signs and the nursing notes.   HISTORY  Chief Complaint Near syncope    HPI Jenna Huber is a 32 y.o. female who reports over the last 2 weeks she has had episodes of feeling flushed and then feeling like her heart was racing then feeling numbness and tingling in her hands.  She has attributed this to panic attacks, she does have a history of anxiety.  She denies chest pain or shortness of breath.  No pleurisy.  No calf pain or swelling.  No nausea vomiting or diaphoresis.  Currently she feels quite well and has no symptoms.  Has not seen anyone about this.  No new medications, does smoke cigarettes.  No drug use  History reviewed. No pertinent past medical history.  There are no active problems to display for this patient.   Past Surgical History:  Procedure Laterality Date  . WISDOM TOOTH EXTRACTION      Prior to Admission medications   Medication Sig Start Date End Date Taking? Authorizing Provider  acetaminophen (TYLENOL) 325 MG tablet Take 650 mg by mouth every 6 (six) hours as needed.    [provider]  albuterol (PROVENTIL HFA;VENTOLIN HFA) 108 (90 Base) MCG/ACT inhaler Inhale 2 puffs into the lungs every 6 (six) hours as needed.    [provider]  aspirin-acetaminophen-caffeine (EXCEDRIN MIGRAINE) [phone] MG tablet Take 1 tablet by mouth every 6 (six) hours as needed for headache.    [provider]  citalopram (CELEXA) 40 MG tablet Take 40 mg by mouth daily.    [provider]  metroNIDAZOLE (FLAGYL) 500 MG tablet Take 1 tablet (500 mg total) by mouth 2 (two) times daily. 10/12/15   Sharyn Creamer, MD  montelukast (SINGULAIR) 10 MG tablet Take 10 mg by mouth at bedtime.    [provider]  ondansetron (ZOFRAN ODT) 4 MG disintegrating tablet Take 1 tablet (4 mg  total) by mouth every 6 (six) hours as needed for nausea or vomiting. 10/12/15   Sharyn Creamer, MD  oxyCODONE-acetaminophen (ROXICET) 5-325 MG tablet Take 0.5 tablets by mouth every 4 (four) hours as needed for moderate pain or severe pain. 10/12/15   Sharyn Creamer, MD     Allergies Penicillins and Vicodin [hydrocodone-acetaminophen]  No family history on file.  Social History Social History   Tobacco Use  . Smoking status: Current Every Day Smoker    Packs/day: 0.50    Years: 3.00    Pack years: 1.50    Types: Cigarettes  . Smokeless tobacco: Never Used  Substance Use Topics  . Alcohol use: Yes    Comment: rarely  . Drug use: No    Review of Systems  Constitutional: No fever/chills Eyes: No visual changes.  ENT: No sore throat. Cardiovascular: Denies chest pain.  Palpitations as above Respiratory: Denies shortness of breath. Gastrointestinal: No abdominal pain.  No nausea, no vomiting.   Genitourinary: Negative for dysuria. Musculoskeletal: Negative for back pain. Skin: Negative for rash. Neurological: Negative for headaches   ____________________________________________   PHYSICAL EXAM:  VITAL SIGNS: ED Triage Vitals  Enc Vitals Group     BP 02/13/18 1238 112/69     Pulse Rate 02/13/18 1238 66     Resp 02/13/18 1238 18     Temp 02/13/18 1238 98.4 F (36.9 C)     Temp src --  SpO2 02/13/18 1238 99 %     Weight 02/13/18 1236 68 kg (150 lb)     Height 02/13/18 1236 1.626 m (5\' 4" )     Head Circumference --      Peak Flow --      Pain Score 02/13/18 1235 3     Pain Loc --      Pain Edu? --      Excl. in GC? --     Constitutional: Alert and oriented. No acute distress.  Eyes: Conjunctivae are normal.   Nose: No congestion/rhinnorhea. Mouth/Throat: Mucous membranes are moist.    Cardiovascular: Normal rate, regular rhythm. Grossly normal heart sounds.  Good peripheral circulation. Respiratory: Normal respiratory effort.  No retractions. Lungs  CTAB. Gastrointestinal: Soft and nontender. No distention.  No CVA tenderness. Genitourinary: deferred Musculoskeletal: No lower extremity tenderness nor edema.  Warm and well perfused Neurologic:  Normal speech and language. No gross focal neurologic deficits are appreciated.  Skin:  Skin is warm, dry and intact. No rash noted. Psychiatric: Mood and affect are normal. Speech and behavior are normal.  ____________________________________________   LABS (all labs ordered are listed, but only abnormal results are displayed)  Labs Reviewed  CBC - Abnormal; Notable for the following components:      Result Value   WBC 12.1 (*)    All other components within normal limits  URINALYSIS, COMPLETE (UACMP) WITH MICROSCOPIC - Abnormal; Notable for the following components:   Color, Urine YELLOW (*)    APPearance HAZY (*)    Glucose, UA 50 (*)    Leukocytes, UA TRACE (*)    Bacteria, UA FEW (*)    All other components within normal limits  BASIC METABOLIC PANEL  GLUCOSE, CAPILLARY   ____________________________________________  EKG  ED ECG REPORT I, Jene Everyobert Jaysten Essner, the attending physician, personally viewed and interpreted this ECG.  Date: 02/13/2018  Rhythm: normal sinus rhythm QRS Axis: normal Intervals: normal ST/T Wave abnormalities: normal Narrative Interpretation: no evidence of acute ischemia  ____________________________________________  RADIOLOGY  None ____________________________________________   PROCEDURES  Procedure(s) performed: No  Procedures   Critical Care performed: No ____________________________________________   INITIAL IMPRESSION / ASSESSMENT AND PLAN / ED COURSE  Pertinent labs & imaging results that were available during my care of the patient were reviewed by me and considered in my medical decision making (see chart for details).  Patient well-appearing and asymptomatic in the emergency department.  Exam is unremarkable.  Lab work is  reassuring.  Anxiety attacks are certainly on the differential, question possible arrhythmia.  EKG is reassuring, will refer to cardiology for possible outpatient monitor    ____________________________________________   FINAL CLINICAL IMPRESSION(S) / ED DIAGNOSES  Final diagnoses:  Palpitations        Note:  This document was prepared using Dragon voice recognition software and may include unintentional dictation errors.     Jene EveryKinner, Sharmel Ballantine, MD 02/13/18 1537

## 2019-03-24 ENCOUNTER — Emergency Department: Payer: Self-pay

## 2019-03-24 ENCOUNTER — Other Ambulatory Visit: Payer: Self-pay

## 2019-03-24 ENCOUNTER — Emergency Department
Admission: EM | Admit: 2019-03-24 | Discharge: 2019-03-24 | Disposition: A | Discharge status: Home / Self Care | Payer: Self-pay | Attending: Emergency Medicine | Admitting: Emergency Medicine

## 2019-03-24 ENCOUNTER — Encounter: Payer: Self-pay | Admitting: Emergency Medicine

## 2019-03-24 DIAGNOSIS — F17210 Nicotine dependence, cigarettes, uncomplicated: Secondary | ICD-10-CM | POA: Insufficient documentation

## 2019-03-24 DIAGNOSIS — R1031 Right lower quadrant pain: Secondary | ICD-10-CM | POA: Insufficient documentation

## 2019-03-24 DIAGNOSIS — J45909 Unspecified asthma, uncomplicated: Secondary | ICD-10-CM | POA: Insufficient documentation

## 2019-03-24 DIAGNOSIS — Z79899 Other long term (current) drug therapy: Secondary | ICD-10-CM | POA: Insufficient documentation

## 2019-03-24 DIAGNOSIS — R109 Unspecified abdominal pain: Secondary | ICD-10-CM

## 2019-03-24 DIAGNOSIS — R110 Nausea: Secondary | ICD-10-CM | POA: Insufficient documentation

## 2019-03-24 HISTORY — DX: Unspecified asthma, uncomplicated: J45.909

## 2019-03-24 LAB — COMPREHENSIVE METABOLIC PANEL
ALT: 8 U/L (ref 0–44)
AST: 17 U/L (ref 15–41)
Albumin: 4.4 g/dL (ref 3.5–5.0)
Alkaline Phosphatase: 56 U/L (ref 38–126)
Anion gap: 8 (ref 5–15)
BUN: 9 mg/dL (ref 6–20)
CO2: 24 mmol/L (ref 22–32)
Calcium: 9.5 mg/dL (ref 8.9–10.3)
Chloride: 107 mmol/L (ref 98–111)
Creatinine, Ser: 0.64 mg/dL (ref 0.44–1.00)
GFR calc Af Amer: >60 mL/min (ref >60)
GFR calc non Af Amer: >60 mL/min (ref >60)
Glucose, Bld: 164 mg/dL — ABNORMAL HIGH (ref 70–99)
Potassium: 3.9 mmol/L (ref 3.5–5.1)
Sodium: 139 mmol/L (ref 135–145)
Total Bilirubin: 0.7 mg/dL (ref 0.3–1.2)
Total Protein: 7.9 g/dL (ref 6.5–8.1)

## 2019-03-24 LAB — URINALYSIS, COMPLETE (UACMP) WITH MICROSCOPIC
Bilirubin Urine: NEGATIVE
Glucose, UA: 50 mg/dL — AB
Hgb urine dipstick: NEGATIVE
Ketones, ur: NEGATIVE mg/dL
Nitrite: NEGATIVE
Protein, ur: NEGATIVE mg/dL
Specific Gravity, Urine: 1.02 (ref 1.005–1.030)
pH: 6 (ref 5.0–8.0)

## 2019-03-24 LAB — CBC
HCT: 40.9 % (ref 36.0–46.0)
Hemoglobin: 13.5 g/dL (ref 12.0–15.0)
MCH: 28.7 pg (ref 26.0–34.0)
MCHC: 33 g/dL (ref 30.0–36.0)
MCV: 86.8 fL (ref 80.0–100.0)
Platelets: 311 10*3/uL (ref 150–400)
RBC: 4.71 MIL/uL (ref 3.87–5.11)
RDW: 13.3 % (ref 11.5–15.5)
WBC: 11.3 10*3/uL — ABNORMAL HIGH (ref 4.0–10.5)
nRBC: 0 % (ref 0.0–0.2)

## 2019-03-24 LAB — POC URINE PREG, ED: Preg Test, Ur: NEGATIVE

## 2019-03-24 LAB — LIPASE, BLOOD: Lipase: 24 U/L (ref 11–51)

## 2019-03-24 MED ORDER — IOHEXOL 300 MG/ML  SOLN
100.0000 mL | Freq: Once | INTRAMUSCULAR | Status: AC | PRN
  Administered 2019-03-24: 100 mL via INTRAVENOUS
  Filled 2019-03-24: qty 100

## 2019-03-24 MED ORDER — IOHEXOL 9 MG/ML PO SOLN
500.0000 mL | ORAL | Status: AC
  Administered 2019-03-24: 500 mL via ORAL
  Filled 2019-03-24 (×2): qty 500

## 2019-03-24 MED ORDER — ONDANSETRON 4 MG PO TBDP: 4.0000 mg | ORAL_TABLET | Freq: Four times a day (QID) | ORAL | 0 refills | Status: DC | PRN

## 2019-03-24 MED ORDER — SODIUM CHLORIDE 0.9 % IV BOLUS: 500.0000 mL | Freq: Once | INTRAVENOUS | Status: DC

## 2019-03-24 MED ORDER — ONDANSETRON HCL 4 MG/2ML IJ SOLN
4.0000 mg | Freq: Once | INTRAMUSCULAR | Status: AC
  Administered 2019-03-24: 16:00:00 4 mg via INTRAVENOUS
  Filled 2019-03-24: qty 2

## 2019-03-24 MED ORDER — KETOROLAC TROMETHAMINE 30 MG/ML IJ SOLN
30.0000 mg | Freq: Once | INTRAMUSCULAR | Status: AC
  Administered 2019-03-24: 30 mg via INTRAVENOUS
  Filled 2019-03-24: qty 1

## 2019-03-24 NOTE — ED Provider Notes (Signed)
[facility] Emergency Department Provider Note   ____________________________________________   First MD Initiated Contact with Patient 03/24/19 1537     (approximate)  I have reviewed the triage vital signs and the nursing notes.   HISTORY  Chief Complaint Abdominal Pain    HPI Jenna Huber is a 34 y.o. female here for evaluation of abdominal pain  Patient reports she did have a right-sided right mid right back pain for about a week now.  Associated with nausea but no vomiting.   No chills.  No pain or burning with urination.  Reports has been ongoing, pretty severe it gets better if she sits still but worse if she moves about it feels somewhat like a pulled muscle at times in her mid abdomen and flank area but not sure.  Denies pregnancy, reports only sexually active with female partner  No chest pain or shortness of breath.  Pain located primarily of the right mid flank and back area  9 out of 10 in intensity fairly sharp especially when she moves  Past Medical History:  Diagnosis Date  . Asthma     There are no problems to display for this patient.   Past Surgical History:  Procedure Laterality Date  . WISDOM TOOTH EXTRACTION      Prior to Admission medications   Medication Sig Start Date End Date Taking? Authorizing Provider  acetaminophen (TYLENOL) 325 MG tablet Take 650 mg by mouth every 6 (six) hours as needed.    [provider]  albuterol (PROVENTIL HFA;VENTOLIN HFA) 108 (90 Base) MCG/ACT inhaler Inhale 2 puffs into the lungs every 6 (six) hours as needed.    [provider]  aspirin-acetaminophen-caffeine (EXCEDRIN MIGRAINE) [phone] MG tablet Take 1 tablet by mouth every 6 (six) hours as needed for headache.    [provider]  citalopram (CELEXA) 40 MG tablet Take 40 mg by mouth daily.    [provider]  montelukast (SINGULAIR) 10 MG tablet Take 10 mg by mouth at bedtime.    [provider]  ondansetron (ZOFRAN ODT) 4 MG disintegrating tablet Take 1 tablet (4 mg total) by mouth every 6 (six) hours as needed for nausea or vomiting. 03/24/19   Sharyn Creamer, MD    Allergies Penicillins and Vicodin [hydrocodone-acetaminophen]  No family history on file.  Social History Social History   Tobacco Use  . Smoking status: Current Every Day Smoker    Packs/day: 0.50    Years: 3.00    Pack years: 1.50    Types: Cigarettes  . Smokeless tobacco: Never Used  Substance Use Topics  . Alcohol use: Yes    Comment: rarely  . Drug use: No    Review of Systems Constitutional: Felt like she had a low-grade fever.  No known Covid exposure Eyes: No visual changes. ENT: No sore throat. Cardiovascular: Denies chest pain. Respiratory: Denies shortness of breath. Gastrointestinal: See HPI genitourinary: Negative for dysuria. Musculoskeletal: Negative for back pain primarily along the right flank region. Skin: Negative for rash. Neurological: Negative for headaches, areas of focal weakness or numbness.    ____________________________________________   PHYSICAL EXAM:  VITAL SIGNS: ED Triage Vitals [03/24/19 1519]  Enc Vitals Group     BP 125/61     Pulse Rate 69     Resp 18     Temp 98.7 F (37.1 C)     Temp Source Oral     SpO2 98 %     Weight 160  lb (72.6 kg)     Height 5\' 4"  (1.626 m)     Head Circumference      Peak Flow      Pain Score 10     Pain Loc      Pain Edu?      Excl. in GC?     Constitutional: Alert and oriented. Well appearing and in no acute distress except she appears in pain especially if she tries to move about she seems to have quite a bit of pain in the right flank region. Eyes: Conjunctivae are normal. Head: Atraumatic. Nose: No congestion/rhinnorhea. Mouth/Throat: Mucous membranes are moist. Neck: No stridor.  Cardiovascular: Normal rate, regular rhythm. Grossly normal heart sounds.  Good peripheral circulation. Respiratory:  Normal respiratory effort.  No retractions. Lungs CTAB. Gastrointestinal: Soft and nontender except definite pain over the right mid abdomen unclear if located in the abdominal wall, no obvious positive Murphy, there is some pain at McBurney's point but no severe symptoms and there is no rebound or guarding. No distention. Musculoskeletal: No lower extremity tenderness nor edema.  Moderate right-sided CVA angle tenderness.  None on the left. Neurologic:  Normal speech and language. No gross focal neurologic deficits are appreciated.  Skin:  Skin is warm, dry and intact. No rash noted. Psychiatric: Mood and affect are normal. Speech and behavior are normal.  ____________________________________________   LABS (all labs ordered are listed, but only abnormal results are displayed)  Labs Reviewed  COMPREHENSIVE METABOLIC PANEL - Abnormal; Notable for the following components:      Result Value   Glucose, Bld 164 (*)    All other components within normal limits  CBC - Abnormal; Notable for the following components:   WBC 11.3 (*)    All other components within normal limits  URINALYSIS, COMPLETE (UACMP) WITH MICROSCOPIC - Abnormal; Notable for the following components:   Color, Urine YELLOW (*)    APPearance HAZY (*)    Glucose, UA 50 (*)    Leukocytes,Ua TRACE (*)    Bacteria, UA RARE (*)    All other components within normal limits  LIPASE, BLOOD  PREGNANCY, URINE  POC URINE PREG, ED   ____________________________________________  EKG   ____________________________________________  RADIOLOGY  CT ABDOMEN PELVIS W CONTRAST  Result Date: 03/24/2019 CLINICAL DATA:  Right lower quadrant and right flank pain. EXAM: CT ABDOMEN AND PELVIS WITH CONTRAST TECHNIQUE: Multidetector CT imaging of the abdomen and pelvis was performed using the standard protocol following bolus administration of intravenous contrast. CONTRAST:  03/26/2019 OMNIPAQUE IOHEXOL 300 MG/ML  SOLN COMPARISON:  10/12/2015 CT  abdomen/pelvis. FINDINGS: Lower chest: New solid 3 mm basilar left lower lobe pulmonary nodule (series 4/image 10). Hepatobiliary: Normal liver size. No liver mass. Normal gallbladder with no radiopaque cholelithiasis. No biliary ductal dilatation. Pancreas: Normal, with no mass or duct dilation. Spleen: Normal size spleen. Subcentimeter hypodense inferior splenic lesion is unchanged and considered benign. No additional splenic lesions. Adrenals/Urinary Tract: Normal adrenals. No hydronephrosis. No contour deforming renal masses. Normal bladder. Stomach/Bowel: Normal non-distended stomach. Normal caliber small bowel with no small bowel wall thickening. Normal appendix. Oral contrast transits to the cecum. Normal large bowel with no diverticulosis, large bowel wall thickening or pericolonic fat stranding. Vascular/Lymphatic: Normal caliber abdominal aorta. Patent portal, splenic and renal veins. No pathologically enlarged lymph nodes in the abdomen or pelvis. Reproductive: Normal anteverted uterus. Left ovarian 2.4 cm corpus luteum. Otherwise no adnexal lesions. Other: No pneumoperitoneum, ascites or focal fluid collection. Musculoskeletal: No aggressive  appearing focal osseous lesions. Moderate degenerative disc disease at L5-S1. IMPRESSION: 1. No acute abnormality. No evidence of bowel obstruction or acute bowel inflammation. Normal appendix. No hydronephrosis. 2. New solid 3 mm left lung base pulmonary nodule. No follow-up required unless the patient has risk factors for lung malignancy, in which case a follow-up chest CT may be obtained in 12 months. 3. Left ovarian corpus luteum. Electronically Signed   By: Ilona Sorrel M.D.   On: 03/24/2019 17:56     ____________________________________________   PROCEDURES  Procedure(s) performed: None  Procedures  Critical Care performed: No  ____________________________________________   INITIAL IMPRESSION / ASSESSMENT AND PLAN / ED COURSE  Pertinent labs  & imaging results that were available during my care of the patient were reviewed by me and considered in my medical decision making (see chart for details).   Differential diagnosis includes but is not limited to, abdominal perforation, aortic dissection, cholecystitis, appendicitis, diverticulitis, colitis, esophagitis/gastritis, kidney stone, pyelonephritis, urinary tract infection, aortic aneurysm. All are considered in decision and treatment plan. Based upon the patient's presentation and risk factors, suspect flank pain etiology somewhat unclear, but may be musculoskeletal in nature given the positional this with somewhat reassuring abdominal exam though some definite focal right flank discomfort.  Known pelvic or adnexal type pathology, no symptoms to suggest acute ovarian or gynecologic etiology of right-sided flank pain  Preg test neg  ----------------------------------------- 7:14 PM on 03/24/2019 -----------------------------------------  Resting comfortably.  Patient reports after Toradol she feels much much better.  No distress.  Suspect at this point with reassuring imaging studies, no acute right-sided pathology at this is likely musculoskeletal in nature.  I do not think the left ovarian cyst explains her symptoms.  Discussed with the patient nodule and need for follow-up.  She does smoke and is amenable to getting follow-up on this in about 54-month timeframe  Return precautions and treatment recommendations and follow-up discussed with the patient who is agreeable with the plan.         ____________________________________________   FINAL CLINICAL IMPRESSION(S) / ED DIAGNOSES  Final diagnoses:  Acute right flank pain        Note:  This document was prepared using Dragon voice recognition software and may include unintentional dictation errors       Delman Kitten, MD 03/24/19 [phone]

## 2019-03-24 NOTE — ED Triage Notes (Signed)
Says right lower quad pain/back pain.

## 2019-03-24 NOTE — Discharge Instructions (Signed)
You were seen in the emergency room for abdominal pain/flank pain.  Your work-up here is thus far reassuring.  However, I do recommend he stop smoking and have follow-up for a small nodule in about 1 years time as we discussed in your left lung.  Please let your primary care doctor know about this is.  Please return to the emergency room right away if you are to develop a fever, severe nausea, your pain becomes severe or worsens, you are unable to keep food down, begin vomiting any dark or bloody fluid, you develop any dark or bloody stools, feel dehydrated, or other new concerns or symptoms arise.

## 2019-03-25 LAB — POCT PREGNANCY, URINE: Preg Test, Ur: NEGATIVE

## 2019-07-18 ENCOUNTER — Emergency Department
Admission: EM | Admit: 2019-07-18 | Discharge: 2019-07-18 | Disposition: A | Discharge status: Home / Self Care | Payer: Managed Care, Other (non HMO) | Attending: Emergency Medicine | Admitting: Emergency Medicine

## 2019-07-18 ENCOUNTER — Other Ambulatory Visit: Payer: Self-pay

## 2019-07-18 DIAGNOSIS — R220 Localized swelling, mass and lump, head: Secondary | ICD-10-CM | POA: Insufficient documentation

## 2019-07-18 DIAGNOSIS — Z5321 Procedure and treatment not carried out due to patient leaving prior to being seen by health care provider: Secondary | ICD-10-CM | POA: Insufficient documentation

## 2019-07-18 NOTE — ED Triage Notes (Addendum)
Patient c/o 'cyst' right face, proximal to ear. Hx of the same in the past. Patient reports fever at home, afebrile in triage. No antipyretics taken prior to arrival.

## 2019-07-18 NOTE — ED Notes (Signed)
Patient seen walking outside.

## 2019-07-18 NOTE — ED Notes (Signed)
Patient has not returned to lobby

## 2019-07-21 ENCOUNTER — Other Ambulatory Visit: Payer: Self-pay

## 2019-07-21 ENCOUNTER — Encounter: Payer: Self-pay | Admitting: Emergency Medicine

## 2019-07-21 ENCOUNTER — Emergency Department
Admission: EM | Admit: 2019-07-21 | Discharge: 2019-07-21 | Disposition: A | Discharge status: Home / Self Care | Payer: Self-pay | Attending: Emergency Medicine | Admitting: Emergency Medicine

## 2019-07-21 DIAGNOSIS — H6001 Abscess of right external ear: Secondary | ICD-10-CM | POA: Insufficient documentation

## 2019-07-21 DIAGNOSIS — L0291 Cutaneous abscess, unspecified: Secondary | ICD-10-CM

## 2019-07-21 DIAGNOSIS — J45909 Unspecified asthma, uncomplicated: Secondary | ICD-10-CM | POA: Insufficient documentation

## 2019-07-21 DIAGNOSIS — Z79899 Other long term (current) drug therapy: Secondary | ICD-10-CM | POA: Insufficient documentation

## 2019-07-21 DIAGNOSIS — F17210 Nicotine dependence, cigarettes, uncomplicated: Secondary | ICD-10-CM | POA: Insufficient documentation

## 2019-07-21 MED ORDER — SULFAMETHOXAZOLE-TRIMETHOPRIM 800-160 MG PO TABS
1.0000 | ORAL_TABLET | Freq: Once | ORAL | Status: AC
  Administered 2019-07-21: 1 via ORAL
  Filled 2019-07-21: qty 1

## 2019-07-21 MED ORDER — TRAMADOL HCL 50 MG PO TABS
50.0000 mg | ORAL_TABLET | Freq: Four times a day (QID) | ORAL | 0 refills | Status: DC | PRN
Start: 2019-07-21 — End: 2019-10-04

## 2019-07-21 MED ORDER — SULFAMETHOXAZOLE-TRIMETHOPRIM 800-160 MG PO TABS: 1.0000 | ORAL_TABLET | Freq: Two times a day (BID) | ORAL | 0 refills | Status: DC

## 2019-07-21 MED ORDER — IBUPROFEN 600 MG PO TABS
600.0000 mg | ORAL_TABLET | Freq: Once | ORAL | Status: AC
  Administered 2019-07-21: 600 mg via ORAL
  Filled 2019-07-21: qty 1

## 2019-07-21 MED ORDER — IBUPROFEN 600 MG PO TABS: 600.0000 mg | ORAL_TABLET | Freq: Three times a day (TID) | ORAL | 0 refills | Status: DC | PRN

## 2019-07-21 MED ORDER — TRAMADOL HCL 50 MG PO TABS
50.0000 mg | ORAL_TABLET | Freq: Once | ORAL | Status: AC
  Administered 2019-07-21: 50 mg via ORAL
  Filled 2019-07-21: qty 1

## 2019-07-21 NOTE — ED Provider Notes (Signed)
[facility] Emergency Department Provider Note   ____________________________________________   First MD Initiated Contact with Patient 07/21/19 1011     (approximate)  I have reviewed the triage vital signs and the nursing notes.   HISTORY  Chief Complaint Abscess No drainage.   HPI Jenna Huber is a 34 y.o. female patient presents with nodule lesion anterior right ear.  Patient states lesion has been growing for the past couple of weeks.  Patient rates pain a 7/10.  Patient described pain as "achy".  No palliative measure for complaint.  Patient state previous lesions in his area which resolved without medical intervention.         Past Medical History:  Diagnosis Date  . Asthma     There are no problems to display for this patient.   Past Surgical History:  Procedure Laterality Date  . WISDOM TOOTH EXTRACTION      Prior to Admission medications   Medication Sig Start Date End Date Taking? Authorizing Provider  acetaminophen (TYLENOL) 325 MG tablet Take 650 mg by mouth every 6 (six) hours as needed.    [provider]  albuterol (PROVENTIL HFA;VENTOLIN HFA) 108 (90 Base) MCG/ACT inhaler Inhale 2 puffs into the lungs every 6 (six) hours as needed.    [provider]  aspirin-acetaminophen-caffeine (EXCEDRIN MIGRAINE) [phone] MG tablet Take 1 tablet by mouth every 6 (six) hours as needed for headache.    [provider]  citalopram (CELEXA) 40 MG tablet Take 40 mg by mouth daily.    [provider]  ibuprofen (ADVIL) 600 MG tablet Take 1 tablet (600 mg total) by mouth every 8 (eight) hours as needed. 07/21/19   Sable Feil, PA-C  montelukast (SINGULAIR) 10 MG tablet Take 10 mg by mouth at bedtime.    [provider]  sulfamethoxazole-trimethoprim (BACTRIM DS) 800-160 MG tablet Take 1 tablet by mouth 2 (two) times daily. 07/21/19   Sable Feil, PA-C  traMADol (ULTRAM) 50 MG tablet Take 1  tablet (50 mg total) by mouth every 6 (six) hours as needed. 07/21/19 07/20/20  Sable Feil, PA-C    Allergies Penicillins and Vicodin [hydrocodone-acetaminophen]  No family history on file.  Social History Social History   Tobacco Use  . Smoking status: Current Every Day Smoker    Packs/day: 0.50    Years: 3.00    Pack years: 1.50    Types: Cigarettes  . Smokeless tobacco: Never Used  Substance Use Topics  . Alcohol use: Yes    Comment: rarely  . Drug use: No    Review of Systems  Constitutional: No fever/chills Eyes: No visual changes. ENT: No sore throat. Cardiovascular: Denies chest pain. Respiratory: Denies shortness of breath. Gastrointestinal: No abdominal pain.  No nausea, no vomiting.  No diarrhea.  No constipation. Genitourinary: Negative for dysuria. Musculoskeletal: Negative for back pain. Skin: Negative for rash.  Nodule lesion anterior right ear. Neurological: Negative for headaches, focal weakness or numbness. Allergic/Immunilogical: Penicillin and Vicodin.  ____________________________________________   PHYSICAL EXAM:  VITAL SIGNS: ED Triage Vitals  Enc Vitals Group     BP 07/21/19 0930 (!) 110/56     Pulse Rate 07/21/19 0930 69     Resp 07/21/19 0930 16     Temp 07/21/19 0930 97.9 F (36.6 C)     Temp Source 07/21/19 0930 Oral     SpO2 07/21/19 0930 98 %     Weight 07/21/19 0926 160 lb (72.6 kg)  Height 07/21/19 0926 5\' 4"  (1.626 m)     Head Circumference --      Peak Flow --      Pain Score 07/21/19 0926 7     Pain Loc --      Pain Edu? --      Excl. in GC? --    Constitutional: Alert and oriented. Well appearing and in no acute distress. Hematological/Lymphatic/Immunilogical: No cervical lymphadenopathy. Cardiovascular: Normal rate, regular rhythm. Grossly normal heart sounds.  Good peripheral circulation. Respiratory: Normal respiratory effort.  No retractions. Lungs CTAB. Skin: Nonfluctuant nodule lesion anterior right ear.  Psychiatric: Mood and affect are normal. Speech and behavior are normal.  ____________________________________________   LABS (all labs ordered are listed, but only abnormal results are displayed)  Labs Reviewed - No data to display ____________________________________________  EKG   ____________________________________________  RADIOLOGY  ED MD interpretation:    Official radiology report(s): No results found.  ____________________________________________   PROCEDURES  Procedure(s) performed (including Critical Care):  Procedures   ____________________________________________   INITIAL IMPRESSION / ASSESSMENT AND PLAN / ED COURSE  As part of my medical decision making, I reviewed the following data within the electronic MEDICAL RECORD NUMBER     Abscess anterior right ear.  Discussed with patient rationale for not incised and drained at this time.  Lesion is nonfluctuant.  Patient given a prescription for Bactrim DS, tramadol, and ibuprofen.  Patient given discharge care instruction advised return back in 2 days for reevaluation.    Jenna Huber was evaluated in Emergency Department on 07/21/2019 for the symptoms described in the history of present illness. She was evaluated in the context of the global COVID-19 pandemic, which necessitated consideration that the patient might be at risk for infection with the SARS-CoV-2 virus that causes COVID-19. Institutional protocols and algorithms that pertain to the evaluation of patients at risk for COVID-19 are in a state of rapid change based on information released by regulatory bodies including the CDC and federal and state organizations. These policies and algorithms were followed during the patient's care in the ED.       ____________________________________________   FINAL CLINICAL IMPRESSION(S) / ED DIAGNOSES  Final diagnoses:  Abscess     ED Discharge Orders         Ordered    sulfamethoxazole-trimethoprim  (BACTRIM DS) 800-160 MG tablet  2 times daily     07/21/19 1020    traMADol (ULTRAM) 50 MG tablet  Every 6 hours PRN     07/21/19 1020    ibuprofen (ADVIL) 600 MG tablet  Every 8 hours PRN     07/21/19 1020           Note:  This document was prepared using Dragon voice recognition software and may include unintentional dictation errors.    07/23/19, PA-C 07/21/19 1025    07/23/19, MD 07/21/19 2034

## 2019-07-21 NOTE — Discharge Instructions (Addendum)
Your abscess is nonfluctuant at this time.  Follow discharge care instructions and take medication as directed.  Apply warm compresses to area as directed.  Return back in 2 days for reevaluation.

## 2019-07-21 NOTE — ED Notes (Signed)
See triage note  Presents with possible abscess area near right ear    States area has been there for couple of years  But now area is more swollen and tender to touch

## 2019-07-21 NOTE — ED Triage Notes (Signed)
Pt reports abscess to base of her right ear for the past couple of weeks. Pt reports hx of the same and has ran intermittent fevers.

## 2019-07-23 ENCOUNTER — Other Ambulatory Visit: Payer: Self-pay

## 2019-07-23 ENCOUNTER — Emergency Department
Admission: EM | Admit: 2019-07-23 | Discharge: 2019-07-23 | Disposition: A | Discharge status: Home / Self Care | Payer: Self-pay | Attending: Emergency Medicine | Admitting: Emergency Medicine

## 2019-07-23 ENCOUNTER — Encounter: Payer: Self-pay | Admitting: Emergency Medicine

## 2019-07-23 DIAGNOSIS — L0201 Cutaneous abscess of face: Secondary | ICD-10-CM | POA: Insufficient documentation

## 2019-07-23 DIAGNOSIS — F17210 Nicotine dependence, cigarettes, uncomplicated: Secondary | ICD-10-CM | POA: Insufficient documentation

## 2019-07-23 MED ORDER — LIDOCAINE HCL (PF) 1 % IJ SOLN
5.0000 mL | Freq: Once | INTRAMUSCULAR | Status: AC
  Administered 2019-07-23: 5 mL
  Filled 2019-07-23: qty 5

## 2019-07-23 NOTE — ED Provider Notes (Signed)
[facility] Emergency Department Provider Note  ____________________________________________   First MD Initiated Contact with Patient 07/23/19 1415     (approximate)  I have reviewed the triage vital signs and the nursing notes.   HISTORY  Chief Complaint Abscess   HPI Jenna Huber is a 34 y.o. female presents to the ED with an abscess near her right ear.  Patient states that she was seen 2 days ago at which time she was placed on antibiotic.  Patient states that today it feels very soft.  She denies any fever or chills.  Her pain as 7 out of 10.       Past Medical History:  Diagnosis Date  . Asthma     There are no problems to display for this patient.   Past Surgical History:  Procedure Laterality Date  . WISDOM TOOTH EXTRACTION      Prior to Admission medications   Medication Sig Start Date End Date Taking? Authorizing Provider  acetaminophen (TYLENOL) 325 MG tablet Take 650 mg by mouth every 6 (six) hours as needed.    [provider]  albuterol (PROVENTIL HFA;VENTOLIN HFA) 108 (90 Base) MCG/ACT inhaler Inhale 2 puffs into the lungs every 6 (six) hours as needed.    [provider]  aspirin-acetaminophen-caffeine (EXCEDRIN MIGRAINE) [phone] MG tablet Take 1 tablet by mouth every 6 (six) hours as needed for headache.    [provider]  citalopram (CELEXA) 40 MG tablet Take 40 mg by mouth daily.    [provider]  ibuprofen (ADVIL) 600 MG tablet Take 1 tablet (600 mg total) by mouth every 8 (eight) hours as needed. 07/21/19   Joni Reining, PA-C  montelukast (SINGULAIR) 10 MG tablet Take 10 mg by mouth at bedtime.    [provider]  sulfamethoxazole-trimethoprim (BACTRIM DS) 800-160 MG tablet Take 1 tablet by mouth 2 (two) times daily. 07/21/19   Joni Reining, PA-C  traMADol (ULTRAM) 50 MG tablet Take 1 tablet (50 mg total) by mouth every 6 (six) hours as needed. 07/21/19 07/20/20  Joni Reining, PA-C    Allergies Penicillins and Vicodin [hydrocodone-acetaminophen]  No family history on file.  Social History Social History   Tobacco Use  . Smoking status: Current Every Day Smoker    Packs/day: 0.50    Years: 3.00    Pack years: 1.50    Types: Cigarettes  . Smokeless tobacco: Never Used  Substance Use Topics  . Alcohol use: Yes    Comment: rarely  . Drug use: No    Review of Systems Constitutional: No fever/chills Eyes: No visual changes. ENT: No sore throat. Cardiovascular: Denies chest pain. Respiratory: Denies shortness of breath. Musculoskeletal: Negative for muscle aches. Skin: Positive for abscess. Neurological: Negative for headaches, focal weakness or numbness. ____________________________________________   PHYSICAL EXAM:  VITAL SIGNS: ED Triage Vitals  Enc Vitals Group     BP 07/23/19 1356 (!) 103/48     Pulse Rate 07/23/19 1353 81     Resp 07/23/19 1353 16     Temp 07/23/19 1353 98.5 F (36.9 C)     Temp Source 07/23/19 1353 Oral     SpO2 07/23/19 1353 97 %     Weight 07/23/19 1354 170 lb (77.1 kg)     Height 07/23/19 1354 5\' 4"  (1.626 m)     Head Circumference --      Peak Flow --      Pain Score 07/23/19 1354 7  Pain Loc --      Pain Edu? --      Excl. in [place]? --     Constitutional: Alert and oriented. Well appearing and in no acute distress. Eyes: Conjunctivae are normal.  Head: Atraumatic. Nose: No congestion/rhinnorhea. Neck: No stridor.   Hematological/Lymphatic/Immunilogical: No cervical lymphadenopathy. Cardiovascular:  Good peripheral circulation. Respiratory: Normal respiratory effort.  No retractions.  Musculoskeletal: Moves upper and lower extremities any difficulty.  Normal gait was noted. Neurologic:  Normal speech and language. No gross focal neurologic deficits are appreciated.  Skin:  Skin is warm, dry.  Soft fluctuant abscess noted right preauricular area.  No extending cellulitis present. Psychiatric:  Mood and affect are normal. Speech and behavior are normal.  ____________________________________________   LABS (all labs ordered are listed, but only abnormal results are displayed)  Labs Reviewed - No data to display  PROCEDURES  Procedure(s) performed (including Critical Care):  Marland KitchenMarland KitchenIncision and Drainage  Date/Time: 07/23/2019 3:15 PM Performed by: Johnn Hai, PA-C Authorized by: Johnn Hai, PA-C   Consent:    Consent obtained:  Verbal   Consent given by:  Patient   Risks discussed:  Infection, pain and incomplete drainage Location:    Type:  Abscess   Size:  2.0   Location:  Head   Head location:  Face Pre-procedure details:    Skin preparation:  Betadine Anesthesia (see MAR for exact dosages):    Anesthesia method:  Local infiltration   Local anesthetic:  Lidocaine 1% w/o epi Procedure type:    Complexity:  Simple Procedure details:    Needle aspiration: no     Incision types:  Stab incision   Incision depth:  Dermal   Scalpel blade:  11   Wound management:  Probed and deloculated   Drainage:  Purulent   Drainage amount:  Moderate   Wound treatment:  Drain placed   Packing materials:  1/4 in iodoform gauze   Amount 1/4" iodoform:  2.0 Post-procedure details:    Patient tolerance of procedure:  Tolerated well, no immediate complications     ____________________________________________   INITIAL IMPRESSION / ASSESSMENT AND PLAN / ED COURSE  As part of my medical decision making, I reviewed the following data within the electronic MEDICAL RECORD NUMBER Notes from prior ED visits and [place] Controlled Substance Database  33 year old female presents to the ED with complaint of abscess to her face.  Patient was seen in the ED 2 days ago which time she was started on Bactrim DS twice daily and tramadol as needed for pain.  Today the area was appropriate for I&D.  Patient tolerated procedure well.  She is aware that if the drain has not fallen out on its own  that she will need to pull it out or return to the emergency department.  ____________________________________________   FINAL CLINICAL IMPRESSION(S) / ED DIAGNOSES  Final diagnoses:  Abscess of face     ED Discharge Orders    None       Note:  This document was prepared using Dragon voice recognition software and may include unintentional dictation errors.    Johnn Hai, PA-C 07/23/19 1551    Carrie Mew, MD 07/24/19 2213

## 2019-07-23 NOTE — Discharge Instructions (Addendum)
Continue taking the antibiotic until completely finished.  If in 2 days the drain has not fallen out on its own or if you are unable to get it out by pulling on it return to the emergency department for [place] to remove it in 2 days.  You may take Tylenol or ibuprofen with the tramadol if needed for pain.  Also apply warm moist compresses to the area to encourage more drainage when possible.

## 2019-07-23 NOTE — ED Notes (Signed)
See triage note  Was seen couple of days ago with possible abscess area near right ear  States she is here for a recheck  Area remains swollen

## 2019-07-23 NOTE — ED Triage Notes (Signed)
Pt to ED via POV. PT states that she was seen on Wednesday for abscess to the base of her right ear. Pt states that she was given abx and told to come back today and have it drained. Pt is in NAD.

## 2019-07-23 NOTE — ED Notes (Signed)
Area was gently cleaned, gauze was applied over wound which was then covered with a bandaid.

## 2019-09-20 ENCOUNTER — Other Ambulatory Visit: Payer: Self-pay

## 2019-09-20 ENCOUNTER — Ambulatory Visit: Payer: Self-pay | Admitting: Obstetrics and Gynecology

## 2019-09-20 ENCOUNTER — Encounter: Payer: Self-pay | Admitting: Obstetrics and Gynecology

## 2019-09-20 NOTE — Progress Notes (Deleted)
Center, [facility]   No chief complaint on file.   HPI:      [name] is a 34 y.o. No obstetric history on file. whose LMP was No LMP recorded., presents today for NP eval On abx for facial abscess 5/21. Hx of BV in past    Past Medical History:  Diagnosis Date   Asthma     Past Surgical History:  Procedure Laterality Date   WISDOM TOOTH EXTRACTION      No family history on file.  Social History   Socioeconomic History   Marital status: Divorced    Spouse name: Not on file   Number of children: Not on file   Years of education: Not on file   Highest education level: Not on file  Occupational History   Not on file  Tobacco Use   Smoking status: Current Every Day Smoker    Packs/day: 0.50    Years: 3.00    Pack years: 1.50    Types: Cigarettes   Smokeless tobacco: Never Used  Substance and Sexual Activity   Alcohol use: Yes    Comment: rarely   Drug use: No   Sexual activity: Yes    Birth control/protection: None  Other Topics Concern   Not on file  Social History Narrative   Not on file   Social Determinants of Health   Financial Resource Strain:    Difficulty of Paying Living Expenses:   Food Insecurity:    Worried About Programme researcher, broadcasting/film/video in the Last Year:    Barista in the Last Year:   Transportation Needs:    Freight forwarder (Medical):    Lack of Transportation (Non-Medical):   Physical Activity:    Days of Exercise per Week:    Minutes of Exercise per Session:   Stress:    Feeling of Stress :   Social Connections:    Frequency of Communication with Friends and Family:    Frequency of Social Gatherings with Friends and Family:    Attends Religious Services:    Active Member of Clubs or Organizations:    Attends Engineer, structural:    Marital Status:   Intimate Partner Violence:    Fear of Current or Ex-Partner:    Emotionally Abused:    Physically  Abused:    Sexually Abused:     Outpatient Medications Prior to Visit  Medication Sig Dispense Refill   acetaminophen (TYLENOL) 325 MG tablet Take 650 mg by mouth every 6 (six) hours as needed.     albuterol (PROVENTIL HFA;VENTOLIN HFA) 108 (90 Base) MCG/ACT inhaler Inhale 2 puffs into the lungs every 6 (six) hours as needed.     aspirin-acetaminophen-caffeine (EXCEDRIN MIGRAINE) 250-250-65 MG tablet Take 1 tablet by mouth every 6 (six) hours as needed for headache.     citalopram (CELEXA) 40 MG tablet Take 40 mg by mouth daily.     ibuprofen (ADVIL) 600 MG tablet Take 1 tablet (600 mg total) by mouth every 8 (eight) hours as needed. 15 tablet 0   montelukast (SINGULAIR) 10 MG tablet Take 10 mg by mouth at bedtime.     sulfamethoxazole-trimethoprim (BACTRIM DS) 800-160 MG tablet Take 1 tablet by mouth 2 (two) times daily. 20 tablet 0   traMADol (ULTRAM) 50 MG tablet Take 1 tablet (50 mg total) by mouth every 6 (six) hours as needed. 20 tablet 0   No facility-administered medications prior to visit.  ROS:  Review of Systems BREAST: No symptoms   OBJECTIVE:   Vitals:  There were no vitals taken for this visit.  Physical Exam  Results: No results found for this or any previous visit (from the past 24 hour(s)).   Assessment/Plan: No diagnosis found.    No orders of the defined types were placed in this encounter.     No follow-ups on file.  Kennetha Pearman B. Amala Petion, PA-C 09/20/2019 11:48 AM

## 2019-10-02 DIAGNOSIS — Z1371 Encounter for nonprocreative screening for genetic disease carrier status: Secondary | ICD-10-CM

## 2019-10-02 DIAGNOSIS — Z9189 Other specified personal risk factors, not elsewhere classified: Secondary | ICD-10-CM

## 2019-10-02 HISTORY — DX: Encounter for nonprocreative screening for genetic disease carrier status: Z13.71

## 2019-10-02 HISTORY — DX: Other specified personal risk factors, not elsewhere classified: Z91.89

## 2019-10-04 ENCOUNTER — Other Ambulatory Visit: Payer: Self-pay

## 2019-10-04 ENCOUNTER — Encounter: Payer: Self-pay | Admitting: Obstetrics and Gynecology

## 2019-10-04 ENCOUNTER — Ambulatory Visit (INDEPENDENT_AMBULATORY_CARE_PROVIDER_SITE_OTHER): Payer: BC Managed Care – PPO | Admitting: Obstetrics and Gynecology

## 2019-10-04 VITALS — BP 100/60 | Ht 64.0 in | Wt 148.0 lb

## 2019-10-04 DIAGNOSIS — Z803 Family history of malignant neoplasm of breast: Secondary | ICD-10-CM | POA: Diagnosis not present

## 2019-10-04 DIAGNOSIS — B373 Candidiasis of vulva and vagina: Secondary | ICD-10-CM | POA: Diagnosis not present

## 2019-10-04 DIAGNOSIS — R350 Frequency of micturition: Secondary | ICD-10-CM | POA: Diagnosis not present

## 2019-10-04 DIAGNOSIS — B3731 Acute candidiasis of vulva and vagina: Secondary | ICD-10-CM

## 2019-10-04 DIAGNOSIS — R739 Hyperglycemia, unspecified: Secondary | ICD-10-CM

## 2019-10-04 DIAGNOSIS — Z8041 Family history of malignant neoplasm of ovary: Secondary | ICD-10-CM | POA: Diagnosis not present

## 2019-10-04 DIAGNOSIS — Z8049 Family history of malignant neoplasm of other genital organs: Secondary | ICD-10-CM | POA: Diagnosis not present

## 2019-10-04 LAB — POCT URINALYSIS DIPSTICK
Bilirubin, UA: NEGATIVE
Blood, UA: NEGATIVE
Glucose, UA: POSITIVE — AB
Leukocytes, UA: NEGATIVE
Nitrite, UA: NEGATIVE
Protein, UA: POSITIVE — AB
Spec Grav, UA: 1.025 (ref 1.010–1.025)
pH, UA: 5 (ref 5.0–8.0)

## 2019-10-04 LAB — POCT WET PREP WITH KOH
Clue Cells Wet Prep HPF POC: NEGATIVE
KOH Prep POC: NEGATIVE
Trichomonas, UA: NEGATIVE
Yeast Wet Prep HPF POC: POSITIVE

## 2019-10-04 LAB — GLUCOSE, POCT (MANUAL RESULT ENTRY): POC Glucose: 177 mg/dl — AB (ref 70–99)

## 2019-10-04 MED ORDER — TERCONAZOLE 0.8 % VA CREA: 1.0000 | TOPICAL_CREAM | Freq: Every day | VAGINAL | 0 refills | Status: AC

## 2019-10-04 MED ORDER — FLUCONAZOLE 150 MG PO TABS: 150.0000 mg | ORAL_TABLET | Freq: Once | ORAL | 0 refills | Status: AC

## 2019-10-04 NOTE — Patient Instructions (Signed)
I value your feedback and entrusting us with your care. If you get a [facility] patient survey, I would appreciate you taking the time to let us know about your experience today. Thank you!  As of February 10, 2019, your lab results will be released to your MyChart immediately, before I even have a chance to see them. Please give me time to review them and contact you if there are any abnormalities. Thank you for your patience.  

## 2019-10-04 NOTE — Progress Notes (Signed)
Center, [facility]   Chief Complaint  Patient presents with  . Vaginal Discharge    itchiness/irritation x on/off 2 months  . Urinary Tract Infection    frequency urinating, no other sx    HPI:      [name] is a 34 y.o. [id number] whose LMP was Patient's last menstrual period was 09/24/2019 (exact date)., presents today for NP eval of increased vaginal d/c with irritation off and on for the past 2 months. Was on abx 5/21 for abscess and did monistat-3 for subsequent yeast infection with sx improvement, but sx have recurred. No vag odor.  Is treating with AZO yeast and probiotics.   Pt also with urinary frequency with and without good flow, urgency for past month. No dysuria, pelvic pain, fevers, hematuria. Drinks a lot of caffeine/Red Bull daily, as well as water. FH DM on mat and pat sides. Hasn't had anything to eat or drink in 12 hrs. Works 3rd shift.   Pt has FH breast cancer in her MGM, initially in her 39s that came back in both breasts at a later time. Also with ovarian cancer. Pt is adopted and doesn't know pat side. No genetic testing done in past.    Past Medical History:  Diagnosis Date  . Anxiety   . Asthma   . Depressive disorder   . Sciatica   . Seizure [facility])     Past Surgical History:  Procedure Laterality Date  . WISDOM TOOTH EXTRACTION      Family History  Problem Relation Age of Onset  . Ovarian cancer Maternal Grandmother        not sure of age  . Breast cancer Maternal Grandmother        77s, with bilat recurrence at a later time  . Diabetes Maternal Grandmother   . Cervical cancer Maternal Grandmother     Social History   Socioeconomic History  . Marital status: Divorced    Spouse name: Not on file  . Number of children: Not on file  . Years of education: Not on file  . Highest education level: Not on file  Occupational History  . Not on file  Tobacco Use  . Smoking status: Current Every Day Smoker    Packs/day:  0.50    Years: 3.00    Pack years: 1.50    Types: Cigarettes  . Smokeless tobacco: Never Used  Vaping Use  . Vaping Use: Never used  Substance and Sexual Activity  . Alcohol use: Yes    Comment: rarely  . Drug use: No  . Sexual activity: Yes    Birth control/protection: None  Other Topics Concern  . Not on file  Social History Narrative  . Not on file   Social Determinants of Health   Financial Resource Strain:   . Difficulty of Paying Living Expenses:   Food Insecurity:   . Worried About Charity fundraiser in the Last Year:   . Arboriculturist in the Last Year:   Transportation Needs:   . Film/video editor (Medical):   Marland Kitchen Lack of Transportation (Non-Medical):   Physical Activity:   . Days of Exercise per Week:   . Minutes of Exercise per Session:   Stress:   . Feeling of Stress :   Social Connections:   . Frequency of Communication with Friends and Family:   . Frequency of Social Gatherings with Friends and Family:   . Attends Religious Services:   .  Active Member of Clubs or Organizations:   . Attends Archivist Meetings:   Marland Kitchen Marital Status:   Intimate Partner Violence:   . Fear of Current or Ex-Partner:   . Emotionally Abused:   Marland Kitchen Physically Abused:   . Sexually Abused:     Outpatient Medications Prior to Visit  Medication Sig Dispense Refill  . acetaminophen (TYLENOL) 325 MG tablet Take 650 mg by mouth every 6 (six) hours as needed.    Marland Kitchen albuterol (PROVENTIL HFA;VENTOLIN HFA) 108 (90 Base) MCG/ACT inhaler Inhale 2 puffs into the lungs every 6 (six) hours as needed.    Marland Kitchen aspirin-acetaminophen-caffeine (EXCEDRIN MIGRAINE) 250-250-65 MG tablet Take 1 tablet by mouth every 6 (six) hours as needed for headache.    . citalopram (CELEXA) 40 MG tablet Take 40 mg by mouth daily.    Marland Kitchen ibuprofen (ADVIL) 600 MG tablet Take 1 tablet (600 mg total) by mouth every 8 (eight) hours as needed. 15 tablet 0  . montelukast (SINGULAIR) 10 MG tablet Take 10 mg by  mouth at bedtime.    . sulfamethoxazole-trimethoprim (BACTRIM DS) 800-160 MG tablet Take 1 tablet by mouth 2 (two) times daily. 20 tablet 0  . traMADol (ULTRAM) 50 MG tablet Take 1 tablet (50 mg total) by mouth every 6 (six) hours as needed. 20 tablet 0   No facility-administered medications prior to visit.      ROS:  Review of Systems  Constitutional: Negative for fever.  Gastrointestinal: Negative for blood in stool, constipation, diarrhea, nausea and vomiting.  Genitourinary: Positive for dyspareunia, frequency, urgency and vaginal discharge. Negative for dysuria, flank pain, hematuria, vaginal bleeding and vaginal pain.  Musculoskeletal: Negative for back pain.  Skin: Negative for rash.     OBJECTIVE:   Vitals:  BP 100/60   Ht _0  (1.626 m)   Wt 148 lb (67.1 kg)   LMP 09/24/2019 (Exact Date)   BMI 25.40 kg/m   Physical Exam Vitals reviewed.  Constitutional:      Appearance: She is well-developed.  Pulmonary:     Effort: Pulmonary effort is normal.  Genitourinary:    General: Normal vulva.     Pubic Area: No rash.      Labia:        Right: Rash present. No tenderness or lesion.        Left: Rash present. No tenderness or lesion.      Vagina: Vaginal discharge present. No erythema or tenderness.     Cervix: Normal.     Uterus: Normal. Not enlarged and not tender.      Adnexa: Right adnexa normal and left adnexa normal.       Right: No mass or tenderness.         Left: No mass or tenderness.       Comments: BILAT LABIA MAJORA AND MINORA WITH ERYTHEMA, HYPERTROPHY, FEW FISSURES; NO ULCERATIVE LESIONS Musculoskeletal:        General: Normal range of motion.     Cervical back: Normal range of motion.  Skin:    General: Skin is warm and dry.  Neurological:     General: No focal deficit present.     Mental Status: She is alert and oriented to person, place, and time.  Psychiatric:        Mood and Affect: Mood normal.        Behavior: Behavior normal.         Thought Content: Thought content normal.  Judgment: Judgment normal.     Results: Results for orders placed or performed in visit on 10/04/19 (from the past 24 hour(s))  POCT Wet Prep with KOH     Status: Abnormal   Collection Time: 10/04/19  5:33 PM  Result Value Ref Range   Trichomonas, UA Negative    Clue Cells Wet Prep HPF POC neg    Epithelial Wet Prep HPF POC     Yeast Wet Prep HPF POC pos    Bacteria Wet Prep HPF POC     RBC Wet Prep HPF POC     WBC Wet Prep HPF POC     KOH Prep POC Negative Negative  POCT Urinalysis Dipstick     Status: Abnormal   Collection Time: 10/04/19  5:33 PM  Result Value Ref Range   Color, UA yellow    Clarity, UA clear    Glucose, UA Positive (A) Negative   Bilirubin, UA neg    Ketones, UA large    Spec Grav, UA 1.025 1.010 - 1.025   Blood, UA neg    pH, UA 5.0 5.0 - 8.0   Protein, UA Positive (A) Negative   Urobilinogen, UA     Nitrite, UA neg    Leukocytes, UA Negative Negative   Appearance     Odor    POCT Glucose (CBG)     Status: Abnormal   Collection Time: 10/04/19  5:37 PM  Result Value Ref Range   POC Glucose 177 (A) 70 - 99 mg/dl  PT IS FASTING FOR 12 HRS.   Assessment/Plan: Candidal vaginitis - Plan: fluconazole (DIFLUCAN) 150 MG tablet, terconazole (TERAZOL 3) 0.8 % vaginal cream, POCT Wet Prep with KOH; Pos sx, exam and wet prep. Rx diflucan and terazol. Cool compresses prn itch. F/u prn.   Urinary frequency - Plan: POCT Urinalysis Dipstick; neg for UTI but positive glucose. Most likely polyuria due to DM.   Elevated blood sugar - Plan: Comprehensive metabolic panel, Hemoglobin A1c; Check labs. Will f/u with results. Pt to then f/u with PCP--will fax lab results to them.  Family history of breast cancer - Plan: Integrated BRACAnalysis ([place]); MyRisk testing discussed and done today. Will call with results.     Meds ordered this encounter  Medications  . fluconazole (DIFLUCAN) 150 MG  tablet    Sig: Take 1 tablet (150 mg total) by mouth once for 1 dose.    Dispense:  1 tablet    Refill:  0    Order Specific Question:   Supervising Provider    Answer:   Gae Dry [id number]  . terconazole (TERAZOL 3) 0.8 % vaginal cream    Sig: Place 1 applicator vaginally at bedtime for 3 days.    Dispense:  20 g    Refill:  0    Order Specific Question:   Supervising Provider    Answer:   Gae Dry [[id number]]      Return if symptoms worsen or fail to improve.  Rudolf Blizard B. Marinna Blane, PA-C 10/04/2019 5:37 PM

## 2019-10-05 DIAGNOSIS — Z20822 Contact with and (suspected) exposure to covid-19: Secondary | ICD-10-CM | POA: Diagnosis not present

## 2019-10-05 LAB — COMPREHENSIVE METABOLIC PANEL
ALT: 8 IU/L (ref 0–32)
AST: 17 IU/L (ref 0–40)
Albumin/Globulin Ratio: 1.9 (ref 1.2–2.2)
Albumin: 4.7 g/dL (ref 3.8–4.8)
Alkaline Phosphatase: 83 IU/L (ref 48–121)
BUN/Creatinine Ratio: 9 (ref 9–23)
BUN: 6 mg/dL (ref 6–20)
Bilirubin Total: 0.5 mg/dL (ref 0.0–1.2)
CO2: 19 mmol/L — ABNORMAL LOW (ref 20–29)
Calcium: 9.8 mg/dL (ref 8.7–10.2)
Chloride: 103 mmol/L (ref 96–106)
Creatinine, Ser: 0.68 mg/dL (ref 0.57–1.00)
GFR calc Af Amer: 135 mL/min/{1.73_m2} (ref >59)
GFR calc non Af Amer: 117 mL/min/{1.73_m2} (ref >59)
Globulin, Total: 2.5 g/dL (ref 1.5–4.5)
Glucose: 190 mg/dL — ABNORMAL HIGH (ref 65–99)
Potassium: 4.5 mmol/L (ref 3.5–5.2)
Sodium: 140 mmol/L (ref 134–144)
Total Protein: 7.2 g/dL (ref 6.0–8.5)

## 2019-10-05 LAB — HEMOGLOBIN A1C
Est. average glucose Bld gHb Est-mCnc: 283 mg/dL
Hgb A1c MFr Bld: 11.5 % — ABNORMAL HIGH (ref 4.8–5.6)

## 2019-10-17 ENCOUNTER — Encounter: Payer: Self-pay | Admitting: Obstetrics and Gynecology

## 2019-10-19 DIAGNOSIS — F322 Major depressive disorder, single episode, severe without psychotic features: Secondary | ICD-10-CM | POA: Diagnosis not present

## 2019-10-19 DIAGNOSIS — E1165 Type 2 diabetes mellitus with hyperglycemia: Secondary | ICD-10-CM | POA: Diagnosis not present

## 2019-10-19 DIAGNOSIS — F411 Generalized anxiety disorder: Secondary | ICD-10-CM | POA: Diagnosis not present

## 2019-10-19 DIAGNOSIS — Z1389 Encounter for screening for other disorder: Secondary | ICD-10-CM | POA: Diagnosis not present

## 2019-11-02 ENCOUNTER — Encounter: Payer: Self-pay | Admitting: Obstetrics and Gynecology

## 2019-11-02 DIAGNOSIS — Z9189 Other specified personal risk factors, not elsewhere classified: Secondary | ICD-10-CM

## 2019-11-02 DIAGNOSIS — Z1231 Encounter for screening mammogram for malignant neoplasm of breast: Secondary | ICD-10-CM

## 2019-11-02 DIAGNOSIS — Z803 Family history of malignant neoplasm of breast: Secondary | ICD-10-CM

## 2019-11-03 NOTE — Telephone Encounter (Signed)
Pt aware of neg MyRisk results. IBIS=20.3%/riskscore=14.2%. Discussed monthly SBE, yearly CBE and mammos starting age 34. Discussed discrepancy between both breast cancer models. Pt elects to proceed with mammogram now.   Patient understands these results only apply to her and her children, and this is not indicative of genetic testing results of her other family members. It is recommended that her other family members have genetic testing done.  Pt also understands negative genetic testing doesn't mean she will never get any of these cancers.   Hard copy mailed to pt. F/u prn.

## 2020-01-03 ENCOUNTER — Other Ambulatory Visit: Payer: Self-pay

## 2020-01-03 ENCOUNTER — Encounter: Payer: Self-pay | Admitting: *Deleted

## 2020-01-03 ENCOUNTER — Emergency Department
Admission: EM | Admit: 2020-01-03 | Discharge: 2020-01-04 | Disposition: A | Discharge status: Home / Self Care | Payer: Self-pay | Attending: Emergency Medicine | Admitting: Emergency Medicine

## 2020-01-03 DIAGNOSIS — Z7952 Long term (current) use of systemic steroids: Secondary | ICD-10-CM | POA: Insufficient documentation

## 2020-01-03 DIAGNOSIS — J45909 Unspecified asthma, uncomplicated: Secondary | ICD-10-CM | POA: Insufficient documentation

## 2020-01-03 DIAGNOSIS — L02214 Cutaneous abscess of groin: Secondary | ICD-10-CM | POA: Insufficient documentation

## 2020-01-03 DIAGNOSIS — F17210 Nicotine dependence, cigarettes, uncomplicated: Secondary | ICD-10-CM | POA: Insufficient documentation

## 2020-01-03 MED ORDER — OXYCODONE-ACETAMINOPHEN 5-325 MG PO TABS: 1.0000 | ORAL_TABLET | ORAL | 0 refills | Status: AC | PRN

## 2020-01-03 MED ORDER — OXYCODONE-ACETAMINOPHEN 5-325 MG PO TABS
1.0000 | ORAL_TABLET | Freq: Once | ORAL | Status: AC
  Administered 2020-01-03: 1 via ORAL
  Filled 2020-01-03: qty 1

## 2020-01-03 MED ORDER — SULFAMETHOXAZOLE-TRIMETHOPRIM 800-160 MG PO TABS
1.0000 | ORAL_TABLET | Freq: Two times a day (BID) | ORAL | 0 refills | Status: AC
Start: 2020-01-03 — End: 2020-01-13

## 2020-01-03 MED ORDER — ONDANSETRON 4 MG PO TBDP
4.0000 mg | ORAL_TABLET | Freq: Three times a day (TID) | ORAL | 0 refills | Status: DC | PRN
Start: 2020-01-03 — End: 2020-05-02

## 2020-01-03 MED ORDER — ONDANSETRON 4 MG PO TBDP
4.0000 mg | ORAL_TABLET | Freq: Once | ORAL | Status: AC
  Administered 2020-01-03: 4 mg via ORAL
  Filled 2020-01-03: qty 1

## 2020-01-03 MED ORDER — FLUCONAZOLE 100 MG PO TABS
150.0000 mg | ORAL_TABLET | Freq: Every day | ORAL | 0 refills | Status: AC
Start: 2020-01-03 — End: 2020-01-05

## 2020-01-03 MED ORDER — SULFAMETHOXAZOLE-TRIMETHOPRIM 800-160 MG PO TABS
1.0000 | ORAL_TABLET | Freq: Once | ORAL | Status: AC
  Administered 2020-01-03: 1 via ORAL
  Filled 2020-01-03: qty 1

## 2020-01-03 MED ORDER — LIDOCAINE HCL (PF) 1 % IJ SOLN
5.0000 mL | Freq: Once | INTRAMUSCULAR | Status: DC
  Filled 2020-01-03: qty 5

## 2020-01-03 NOTE — ED Triage Notes (Signed)
Pt has abscess left buttock area.  Sx for 4 days.  No drainage.  Diabetic.  Pt also has a yeast infection.  Menses now.  Pt alert.

## 2020-01-03 NOTE — ED Provider Notes (Signed)
[facility] Emergency Department Provider Note  ____________________________________________   First MD Initiated Contact with Patient 01/03/20 2127     (approximate)  I have reviewed the triage vital signs and the nursing notes.   HISTORY  Chief Complaint Abscess   HPI Jenna Huber is a 34 y.o. female who presents to the emergency department for evaluation of abscess of the left inguinal region.  Patient states she had one that drained on its own several weeks ago, over this 1 began 2 days ago.  She is unsure if these 2 were related or in the same location.  She notes increasing redness, tenderness, hardness to palpation over the last 2 days.  She notes pain is increased with walking or sitting.  Patient is a diabetic and has a history of multiple abscesses in multiple places all over her body.  Of note, she also is dealing with a yeast infection that has been recurrent for her.  She has treated this multiple times with short courses of Monistat but it quickly returns.  Her pain is rated a 9/10 and is located at the site of the abscess.  She denies any fever, chills, or other systemic symptoms.         Past Medical History:  Diagnosis Date  . Anxiety   . Asthma   . BRCA negative 10/2019   MyRisk neg  . Depressive disorder   . Family history of breast cancer   . Increased risk of breast cancer 10/2019   IBIS=20.3%/riskscore=14.2%  . Sciatica   . Seizure [facility])     Patient Active Problem List   Diagnosis Date Noted  . Elevated blood sugar 10/04/2019  . Family history of breast cancer 10/04/2019    Past Surgical History:  Procedure Laterality Date  . WISDOM TOOTH EXTRACTION      Prior to Admission medications   Medication Sig Start Date End Date Taking? Authorizing Provider  acetaminophen (TYLENOL) 325 MG tablet Take 650 mg by mouth every 6 (six) hours as needed.    [provider]  albuterol (PROVENTIL HFA;VENTOLIN HFA) 108 (90  Base) MCG/ACT inhaler Inhale 2 puffs into the lungs every 6 (six) hours as needed.    [provider]  fluconazole (DIFLUCAN) 100 MG tablet Take 1.5 tablets (150 mg total) by mouth daily for 2 days. Take 1.5 tablets today, and the other 1.5 tablets in 3 days. 01/03/20 01/05/20  Marlana Salvage, PA  ondansetron (ZOFRAN ODT) 4 MG disintegrating tablet Take 1 tablet (4 mg total) by mouth every 8 (eight) hours as needed for nausea or vomiting. 01/03/20   Marlana Salvage, PA  oxyCODONE-acetaminophen (PERCOCET) 5-325 MG tablet Take 1 tablet by mouth every 4 (four) hours as needed for up to 3 days for severe pain. 01/03/20 01/06/20  Marlana Salvage, PA  sulfamethoxazole-trimethoprim (BACTRIM DS) 800-160 MG tablet Take 1 tablet by mouth 2 (two) times daily for 10 days. 01/03/20 01/13/20  Marlana Salvage, PA    Allergies Penicillins, Hydrocodone, Tramadol, Hydrocodone-acetaminophen, and Vicodin [hydrocodone-acetaminophen]  Family History  Problem Relation Age of Onset  . Ovarian cancer Maternal Grandmother        not sure of age  . Breast cancer Maternal Grandmother        93s, with bilat recurrence at a later time  . Diabetes Maternal Grandmother   . Cervical cancer Maternal Grandmother     Social History Social History   Tobacco Use  . Smoking status: Current  Every Day Smoker    Packs/day: 0.50    Years: 3.00    Pack years: 1.50    Types: Cigarettes  . Smokeless tobacco: Never Used  Vaping Use  . Vaping Use: Never used  Substance Use Topics  . Alcohol use: Not Currently    Comment: rarely  . Drug use: No    Review of Systems Constitutional: No fever/chills Eyes: No visual changes. ENT: No sore throat. Cardiovascular: Denies chest pain. Respiratory: Denies shortness of breath. Gastrointestinal: No abdominal pain.  No nausea, no vomiting.  No diarrhea.  No constipation. Genitourinary: + Inguinal pain/abscess, negative for dysuria. Musculoskeletal: Negative for back  pain. Skin: Negative for rash. Neurological: Negative for headaches, focal weakness or numbness.  ____________________________________________   PHYSICAL EXAM:  VITAL SIGNS: ED Triage Vitals  Enc Vitals Group     BP 01/03/20 2056 125/62     Pulse Rate 01/03/20 2056 87     Resp 01/03/20 2056 18     Temp 01/03/20 2056 98.9 F (37.2 C)     Temp Source 01/03/20 2056 Oral     SpO2 01/03/20 2056 97 %     Weight 01/03/20 2056 148 lb (67.1 kg)     Height 01/03/20 2056 _0  (1.626 m)     Head Circumference --      Peak Flow --      Pain Score 01/03/20 2102 9     Pain Loc --      Pain Edu? --      Excl. in [place]? --     Constitutional: Alert and oriented. Well appearing and in no acute distress. Eyes: Conjunctivae are normal.  Head: Atraumatic. Nose: No congestion/rhinnorhea. Mouth/Throat: Mucous membranes are moist.   Neck: No stridor.   Cardiovascular: Normal rate, regular rhythm. Grossly normal heart sounds.  Good peripheral circulation. Respiratory: Normal respiratory effort.  No retractions. Lungs CTAB. Gastrointestinal: Soft and nontender. No distention. No abdominal bruits. No CVA tenderness. Musculoskeletal: No lower extremity tenderness nor edema.  No joint effusions. Neurologic:  Normal speech and language. No gross focal neurologic deficits are appreciated. No gait instability. Skin: There is a raised erythematous nodule in the left inguinal region that is approximately 1.5 cm x 1.5 cm with surrounding erythema approximately another 1 cm in all directions.  It is well indurated with little fluctuance noted in the center. Psychiatric: Mood and affect are normal. Speech and behavior are normal.  ____________________________________________   LABS (all labs ordered are listed, but only abnormal results are displayed)  Labs Reviewed  AEROBIC CULTURE (SUPERFICIAL SPECIMEN)    ____________________________________________   PROCEDURES  Procedure(s) performed  (including Critical Care):  Marland KitchenMarland KitchenIncision and Drainage  Date/Time: 01/03/2020 11:57 PM Performed by: Marlana Salvage, PA Authorized by: Marlana Salvage, PA   Consent:    Consent obtained:  Verbal   Consent given by:  Patient   Risks discussed:  Bleeding, incomplete drainage, infection and pain   Alternatives discussed:  No treatment and delayed treatment Location:    Type:  Abscess   Location:  Lower extremity   Lower extremity location: inguinal fold. Pre-procedure details:    Skin preparation:  Betadine Anesthesia (see MAR for exact dosages):    Anesthesia method:  Local infiltration   Local anesthetic:  Lidocaine 1% w/o epi Procedure type:    Complexity:  Simple Procedure details:    Incision types:  Stab incision   Incision depth:  Subcutaneous   Scalpel blade:  15   Wound management:  Probed and deloculated and irrigated with saline   Drainage:  Purulent and bloody   Drainage amount:  Copious   Wound treatment:  Wound left open Post-procedure details:    Patient tolerance of procedure:  Tolerated well, no immediate complications     ____________________________________________   INITIAL IMPRESSION / ASSESSMENT AND PLAN / ED COURSE  As part of my medical decision making, I reviewed the following data within the [place] notes reviewed and incorporated and [place] Controlled Substance Database        Patient is a 34 year old female who presents to the emergency department for evaluation of abscess in the left inguinal fold.  This has been progressing steadily for the last 2 days, however patient did have a history of similar approximately 2 weeks ago that drained on its own.  Exam reveals a well indurated 1.5 x 1.5 cm nodule with surrounding erythema.  The area was drained with a stab incision.  See procedure note for details.  The patient tolerated the procedure well and there was copious amounts of purulent drainage from this.  The patient  was placed on antibiotics and given pain medication.  She was also given 2 doses of Diflucan, 1 to take at the time of the prescription fill as well as 1 to take 3 days following.  The patient was instructed she should follow-up with her OB/GYN in approximately 2 days for recheck of the area.  The patient is amenable with this plan and will see her OB in follow-up.  She stable at this time for discharge.      ____________________________________________   FINAL CLINICAL IMPRESSION(S) / ED DIAGNOSES  Final diagnoses:  Soft tissue abscess of inguinal region     ED Discharge Orders         Ordered    fluconazole (DIFLUCAN) 100 MG tablet  Daily        01/03/20 2234    sulfamethoxazole-trimethoprim (BACTRIM DS) 800-160 MG tablet  2 times daily        01/03/20 2234    ondansetron (ZOFRAN ODT) 4 MG disintegrating tablet  Every 8 hours PRN        01/03/20 2234    oxyCODONE-acetaminophen (PERCOCET) 5-325 MG tablet  Every 4 hours PRN        01/03/20 2234          *Please note:  Jenna Huber was evaluated in Emergency Department on 01/03/2020 for the symptoms described in the history of present illness. She was evaluated in the context of the global COVID-19 pandemic, which necessitated consideration that the patient might be at risk for infection with the SARS-CoV-2 virus that causes COVID-19. Institutional protocols and algorithms that pertain to the evaluation of patients at risk for COVID-19 are in a state of rapid change based on information released by regulatory bodies including the CDC and federal and state organizations. These policies and algorithms were followed during the patient's care in the ED.  Some ED evaluations and interventions may be delayed as a result of limited staffing during and the pandemic.*   Note:  This document was prepared using Dragon voice recognition software and may include unintentional dictation errors.    Marlana Salvage, PA 01/04/20 0001      Carrie Mew, MD 01/06/20 1753

## 2020-01-04 NOTE — ED Notes (Signed)
Pt given discharge instructions but departed before this nurse was able to complete discharge process with vitals and e-signature.

## 2020-01-05 LAB — AEROBIC CULTURE W GRAM STAIN (SUPERFICIAL SPECIMEN)

## 2020-01-12 DIAGNOSIS — F411 Generalized anxiety disorder: Secondary | ICD-10-CM | POA: Diagnosis not present

## 2020-01-12 DIAGNOSIS — F322 Major depressive disorder, single episode, severe without psychotic features: Secondary | ICD-10-CM | POA: Diagnosis not present

## 2020-01-12 DIAGNOSIS — E1165 Type 2 diabetes mellitus with hyperglycemia: Secondary | ICD-10-CM | POA: Diagnosis not present

## 2020-03-04 ENCOUNTER — Emergency Department
Admission: EM | Admit: 2020-03-04 | Discharge: 2020-03-04 | Disposition: A | Discharge status: Home / Self Care | Payer: Self-pay | Attending: Emergency Medicine | Admitting: Emergency Medicine

## 2020-03-04 ENCOUNTER — Encounter: Payer: Self-pay | Admitting: Emergency Medicine

## 2020-03-04 DIAGNOSIS — Z5321 Procedure and treatment not carried out due to patient leaving prior to being seen by health care provider: Secondary | ICD-10-CM | POA: Insufficient documentation

## 2020-03-04 DIAGNOSIS — L02416 Cutaneous abscess of left lower limb: Secondary | ICD-10-CM | POA: Insufficient documentation

## 2020-03-04 NOTE — ED Triage Notes (Signed)
Pt reports abscess to the left inner thigh x2 days with pain, no drainage. Pt reports she had an abscess prior but it never went away completely.  Pt denies fever.

## 2020-03-08 ENCOUNTER — Encounter: Payer: Self-pay | Admitting: Emergency Medicine

## 2020-03-08 ENCOUNTER — Emergency Department
Admission: EM | Admit: 2020-03-08 | Discharge: 2020-03-08 | Disposition: A | Discharge status: Home / Self Care | Payer: Self-pay | Attending: Emergency Medicine | Admitting: Emergency Medicine

## 2020-03-08 ENCOUNTER — Encounter (INDEPENDENT_AMBULATORY_CARE_PROVIDER_SITE_OTHER): Payer: Self-pay

## 2020-03-08 ENCOUNTER — Other Ambulatory Visit: Payer: Self-pay

## 2020-03-08 DIAGNOSIS — E1165 Type 2 diabetes mellitus with hyperglycemia: Secondary | ICD-10-CM | POA: Insufficient documentation

## 2020-03-08 DIAGNOSIS — J45909 Unspecified asthma, uncomplicated: Secondary | ICD-10-CM | POA: Insufficient documentation

## 2020-03-08 DIAGNOSIS — Z7984 Long term (current) use of oral hypoglycemic drugs: Secondary | ICD-10-CM | POA: Insufficient documentation

## 2020-03-08 DIAGNOSIS — R739 Hyperglycemia, unspecified: Secondary | ICD-10-CM

## 2020-03-08 DIAGNOSIS — F17210 Nicotine dependence, cigarettes, uncomplicated: Secondary | ICD-10-CM | POA: Insufficient documentation

## 2020-03-08 DIAGNOSIS — L02416 Cutaneous abscess of left lower limb: Secondary | ICD-10-CM | POA: Insufficient documentation

## 2020-03-08 LAB — CBG MONITORING, ED
Glucose-Capillary: 572 mg/dL (ref 70–99)
Glucose-Capillary: 349 mg/dL — ABNORMAL HIGH (ref 70–99)

## 2020-03-08 LAB — COMPREHENSIVE METABOLIC PANEL
ALT: 8 U/L (ref 0–44)
AST: 11 U/L — ABNORMAL LOW (ref 15–41)
Albumin: 4 g/dL (ref 3.5–5.0)
Alkaline Phosphatase: 69 U/L (ref 38–126)
Anion gap: 12 (ref 5–15)
BUN: 11 mg/dL (ref 6–20)
CO2: 20 mmol/L — ABNORMAL LOW (ref 22–32)
Calcium: 9.1 mg/dL (ref 8.9–10.3)
Chloride: 94 mmol/L — ABNORMAL LOW (ref 98–111)
Creatinine, Ser: 0.79 mg/dL (ref 0.44–1.00)
GFR, Estimated: >60 mL/min (ref >60)
Glucose, Bld: 679 mg/dL (ref 70–99)
Potassium: 4.7 mmol/L (ref 3.5–5.1)
Sodium: 126 mmol/L — ABNORMAL LOW (ref 135–145)
Total Bilirubin: 0.9 mg/dL (ref 0.3–1.2)
Total Protein: 6.8 g/dL (ref 6.5–8.1)

## 2020-03-08 LAB — BLOOD GAS, VENOUS
Acid-base deficit: 3.7 mmol/L — ABNORMAL HIGH (ref 0.0–2.0)
Bicarbonate: 20.7 mmol/L (ref 20.0–28.0)
O2 Saturation: 69.3 %
Patient temperature: 37
pCO2, Ven: 35 mmHg — ABNORMAL LOW (ref 44.0–60.0)
pH, Ven: 7.38 (ref 7.250–7.430)
pO2, Ven: 37 mmHg (ref 32.0–45.0)

## 2020-03-08 LAB — CBC
HCT: 41.5 % (ref 36.0–46.0)
Hemoglobin: 14.1 g/dL (ref 12.0–15.0)
MCH: 29.7 pg (ref 26.0–34.0)
MCHC: 34 g/dL (ref 30.0–36.0)
MCV: 87.4 fL (ref 80.0–100.0)
Platelets: 289 10*3/uL (ref 150–400)
RBC: 4.75 MIL/uL (ref 3.87–5.11)
RDW: 13.1 % (ref 11.5–15.5)
WBC: 15.7 10*3/uL — ABNORMAL HIGH (ref 4.0–10.5)
nRBC: 0 % (ref 0.0–0.2)

## 2020-03-08 MED ORDER — SODIUM CHLORIDE 0.9 % IV BOLUS
1000.0000 mL | Freq: Once | INTRAVENOUS | Status: AC
  Administered 2020-03-08: 1000 mL via INTRAVENOUS

## 2020-03-08 MED ORDER — CLINDAMYCIN HCL 300 MG PO CAPS
300.0000 mg | ORAL_CAPSULE | Freq: Three times a day (TID) | ORAL | 0 refills | Status: AC
Start: 2020-03-08 — End: 2020-03-15

## 2020-03-08 NOTE — ED Triage Notes (Signed)
Pt comes into the ED via EMS from home with c/o hyperglycemia, dizziness, increased in thirst today , states she went out of town for 3 days and did not have her metformin [id number] 115/65 76 98%RA #[id number] NS Bolus given

## 2020-03-08 NOTE — ED Provider Notes (Signed)
 [facility] Emergency Department Provider Note ____________________________________________   Event Date/Time   First MD Initiated Contact with Patient 03/08/20 1836     (approximate)  I have reviewed the triage vital signs and the nursing notes.   HISTORY  Chief Complaint Hyperglycemia    HPI Jenna Huber is a 34 y.o. female with history of diabetes on metformin who presents with high blood sugar readings over the last couple of days, most recently reading "high."  The patient reports associated increased thirst and polyuria.  She feels somewhat weak and lightheaded.  The patient states that she recently was out of town and forgot to take her metformin with her so she missed several doses.  She denies any fever or chills.  She has no cough, shortness of breath, or any dysuria.  She has no vomiting or diarrhea.  The patient does report a swollen bump to the left inner thigh with some surrounding redness and some pus drainage.   Past Medical History:  Diagnosis Date  . Anxiety   . Asthma   . BRCA negative 10/2019   MyRisk neg  . Depressive disorder   . Family history of breast cancer   . Increased risk of breast cancer 10/2019   IBIS=20.3%/riskscore=14.2%  . Sciatica   . Seizure (HCC)     Patient Active Problem List   Diagnosis Date Noted  . Elevated blood sugar 10/04/2019  . Family history of breast cancer 10/04/2019    Past Surgical History:  Procedure Laterality Date  . WISDOM TOOTH EXTRACTION      Prior to Admission medications   Medication Sig Start Date End Date Taking? Authorizing Provider  clindamycin (CLEOCIN) 300 MG capsule Take 1 capsule (300 mg total) by mouth 3 (three) times daily for 7 days. 03/08/20 03/15/20 Yes Siadecki, Sebastian, MD  acetaminophen (TYLENOL) 325 MG tablet Take 650 mg by mouth every 6 (six) hours as needed.    [provider]  albuterol (PROVENTIL HFA;VENTOLIN HFA) 108 (90 Base) MCG/ACT inhaler  Inhale 2 puffs into the lungs every 6 (six) hours as needed.    [provider]  ondansetron (ZOFRAN ODT) 4 MG disintegrating tablet Take 1 tablet (4 mg total) by mouth every 8 (eight) hours as needed for nausea or vomiting. 01/03/20   Rodgers, Caitlin J, PA    Allergies Penicillins, Hydrocodone, Tramadol, Hydrocodone-acetaminophen, and Vicodin [hydrocodone-acetaminophen]  Family History  Problem Relation Age of Onset  . Ovarian cancer Maternal Grandmother        not sure of age  . Breast cancer Maternal Grandmother        50s, with bilat recurrence at a later time  . Diabetes Maternal Grandmother   . Cervical cancer Maternal Grandmother     Social History Social History   Tobacco Use  . Smoking status: Current Every Day Smoker    Packs/day: 0.50    Years: 3.00    Pack years: 1.50    Types: Cigarettes  . Smokeless tobacco: Never Used  Vaping Use  . Vaping Use: Never used  Substance Use Topics  . Alcohol use: Not Currently    Comment: rarely  . Drug use: No    Review of Systems  Constitutional: No fever/chills. Eyes: No visual changes. ENT: No sore throat. Cardiovascular: Denies chest pain. Respiratory: Denies shortness of breath. Gastrointestinal: No vomiting or diarrhea.  Genitourinary: Positive for polyuria. Musculoskeletal: Negative for back pain. Skin: Negative for rash. Neurological: Negative for headaches, focal weakness or numbness.     ____________________________________________   PHYSICAL EXAM:  VITAL SIGNS: ED Triage Vitals  Enc Vitals Group     BP 03/08/20 1627 107/66     Pulse Rate 03/08/20 1627 79     Resp 03/08/20 1627 20     Temp 03/08/20 1627 98.3 F (36.8 C)     Temp Source 03/08/20 1627 Oral     SpO2 03/08/20 1627 97 %     Weight 03/08/20 1628 147 lb 14.9 oz (67.1 kg)     Height 03/08/20 1628 5' 4" (1.626 m)     Head Circumference --      Peak Flow --      Pain Score 03/08/20 1628 0     Pain Loc --      Pain Edu? --       Excl. in [place]? --     Constitutional: Alert and oriented. Well appearing and in no acute distress. Eyes: Conjunctivae are normal.  Head: Atraumatic. Nose: No congestion/rhinnorhea. Mouth/Throat: Mucous membranes are moist.   Neck: Normal range of motion.  Cardiovascular: Normal rate, regular rhythm.   Good peripheral circulation. Respiratory: Normal respiratory effort.  No retractions.  Gastrointestinal: No distention.  Musculoskeletal: Extremities warm and well perfused.  Neurologic:  Normal speech and language. No gross focal neurologic deficits are appreciated.  Skin:  Skin is warm and dry.  Left proximal inner thigh with approximately 3 cm fluctuant mass with a central pustule and minimal drainage.  Approximately 1 cm faint surrounding erythema. Psychiatric: Mood and affect are normal. Speech and behavior are normal.  ____________________________________________   LABS (all labs ordered are listed, but only abnormal results are displayed)  Labs Reviewed  CBC - Abnormal; Notable for the following components:      Result Value   WBC 15.7 (*)    All other components within normal limits  COMPREHENSIVE METABOLIC PANEL - Abnormal; Notable for the following components:   Sodium 126 (*)    Chloride 94 (*)    CO2 20 (*)    Glucose, Bld 679 (*)    AST 11 (*)    All other components within normal limits  BLOOD GAS, VENOUS - Abnormal; Notable for the following components:   pCO2, Ven 35 (*)    Acid-base deficit 3.7 (*)    All other components within normal limits  CBG MONITORING, ED - Abnormal; Notable for the following components:   Glucose-Capillary 572 (*)    All other components within normal limits  CBG MONITORING, ED - Abnormal; Notable for the following components:   Glucose-Capillary 349 (*)    All other components within normal limits    ____________________________________________  EKG   ____________________________________________  RADIOLOGY    ____________________________________________   PROCEDURES  Procedure(s) performed: No  Procedures  Critical Care performed: No ____________________________________________   INITIAL IMPRESSION / ASSESSMENT AND PLAN / ED COURSE  Pertinent labs & imaging results that were available during my care of the patient were reviewed by me and considered in my medical decision making (see chart for details).  35 year old female with a history of diabetes on metformin presents with elevated blood sugar readings over the last few days after missing several doses of her metformin when she was out of town.  She has polydipsia and polyuria with some generalized weakness but no vomiting, diarrhea, or fever.  She reports a left inner thigh abscess.  On exam the patient is very well-appearing.  Her vital signs are normal.  Physical exam is unremarkable except for a  small fluctuant mass to the left inner thigh consistent with an abscess, spontaneously draining some pus.  There is minimal surrounding evidence of cellulitis.  Initial lab work-up revealed a glucose of 679 but a normal anion gap.  The patient has some leukocytosis.  Overall I suspect hyperglycemia either related to missing her medication, or the left leg abscess/cellulitis, or combination of both.  After 1 L fluid the repeat fingerstick was 349.  We will give an additional liter of fluids.  There is no evidence of DKA.  The patient feels much better and appears quite comfortable.  She states that her glucose is somewhat poorly controlled at baseline and usually runs around 300.  We will give an additional liter of fluids and plan for discharge.  The abscess appears to be spontaneously draining at least somewhat.  I offered I&D, however the patient would like to avoid this and would prefer conservative management if at all  possible.  I think it is reasonable to give clindamycin, have her apply warm compresses, and monitor the abscess.  ----------------------------------------- 8:53 PM on 03/08/2020 -----------------------------------------  The patient continues to feel well after the second liter of fluids.  She is comfortable to go home.  I counseled her on the results of the work-up and the plan of care.  I have prescribed clindamycin.  She has adequate metformin at home.  Return precautions given, and she expresses understanding.  ____________________________________________   FINAL CLINICAL IMPRESSION(S) / ED DIAGNOSES  Final diagnoses:  Hyperglycemia  Abscess of left thigh      NEW MEDICATIONS STARTED DURING THIS VISIT:  Discharge Medication List as of 03/08/2020  8:52 PM    START taking these medications   Details  clindamycin (CLEOCIN) 300 MG capsule Take 1 capsule (300 mg total) by mouth 3 (three) times daily for 7 days., Starting Thu 03/08/2020, Until Thu 03/15/2020, Normal         Note:  This document was prepared using Dragon voice recognition software and may include unintentional dictation errors.    Siadecki, Sebastian, MD 03/08/20 2335  

## 2020-03-08 NOTE — Discharge Instructions (Signed)
Take the antibiotic as prescribed and finish the full 7-day course.  Make sure you are diligent about taking your metformin as prescribed.  Apply warm compresses to the abscess area several times per day.  Return to the ER for new, worsening, or persistent high sugar readings, increased urination or thirst, weakness or lightheadedness, fever, worsening swelling of the abscess or rash around it, or any other new or worsening symptoms that concern you.

## 2020-03-16 ENCOUNTER — Emergency Department
Admission: EM | Admit: 2020-03-16 | Discharge: 2020-03-16 | Disposition: A | Discharge status: Home / Self Care | Payer: Self-pay | Attending: Emergency Medicine | Admitting: Emergency Medicine

## 2020-03-16 ENCOUNTER — Other Ambulatory Visit: Payer: Self-pay

## 2020-03-16 DIAGNOSIS — R739 Hyperglycemia, unspecified: Secondary | ICD-10-CM

## 2020-03-16 DIAGNOSIS — E1165 Type 2 diabetes mellitus with hyperglycemia: Secondary | ICD-10-CM | POA: Insufficient documentation

## 2020-03-16 DIAGNOSIS — Z716 Tobacco abuse counseling: Secondary | ICD-10-CM | POA: Insufficient documentation

## 2020-03-16 DIAGNOSIS — J45909 Unspecified asthma, uncomplicated: Secondary | ICD-10-CM | POA: Insufficient documentation

## 2020-03-16 DIAGNOSIS — F17210 Nicotine dependence, cigarettes, uncomplicated: Secondary | ICD-10-CM | POA: Insufficient documentation

## 2020-03-16 DIAGNOSIS — L02214 Cutaneous abscess of groin: Secondary | ICD-10-CM | POA: Insufficient documentation

## 2020-03-16 LAB — CBC WITH DIFFERENTIAL/PLATELET
Abs Immature Granulocytes: 0.04 10*3/uL (ref 0.00–0.07)
Basophils Absolute: 0.1 10*3/uL (ref 0.0–0.1)
Basophils Relative: 1 %
Eosinophils Absolute: 0.4 10*3/uL (ref 0.0–0.5)
Eosinophils Relative: 3 %
HCT: 38.8 % (ref 36.0–46.0)
Hemoglobin: 13.1 g/dL (ref 12.0–15.0)
Immature Granulocytes: 0 %
Lymphocytes Relative: 30 %
Lymphs Abs: 3.4 10*3/uL (ref 0.7–4.0)
MCH: 29.3 pg (ref 26.0–34.0)
MCHC: 33.8 g/dL (ref 30.0–36.0)
MCV: 86.8 fL (ref 80.0–100.0)
Monocytes Absolute: 0.9 10*3/uL (ref 0.1–1.0)
Monocytes Relative: 8 %
Neutro Abs: 6.5 10*3/uL (ref 1.7–7.7)
Neutrophils Relative %: 58 %
Platelets: 284 10*3/uL (ref 150–400)
RBC: 4.47 MIL/uL (ref 3.87–5.11)
RDW: 13.4 % (ref 11.5–15.5)
WBC: 11.3 10*3/uL — ABNORMAL HIGH (ref 4.0–10.5)
nRBC: 0 % (ref 0.0–0.2)

## 2020-03-16 LAB — BASIC METABOLIC PANEL
Anion gap: 14 (ref 5–15)
BUN: 12 mg/dL (ref 6–20)
CO2: 24 mmol/L (ref 22–32)
Calcium: 9 mg/dL (ref 8.9–10.3)
Chloride: 98 mmol/L (ref 98–111)
Creatinine, Ser: 0.47 mg/dL (ref 0.44–1.00)
GFR, Estimated: >60 mL/min (ref >60)
Glucose, Bld: 229 mg/dL — ABNORMAL HIGH (ref 70–99)
Potassium: 4.4 mmol/L (ref 3.5–5.1)
Sodium: 136 mmol/L (ref 135–145)

## 2020-03-16 MED ORDER — LIDOCAINE-EPINEPHRINE 2 %-1:100000 IJ SOLN
20.0000 mL | Freq: Once | INTRAMUSCULAR | Status: AC
  Administered 2020-03-16: 20 mL via INTRADERMAL
  Filled 2020-03-16: qty 1

## 2020-03-16 MED ORDER — FENTANYL CITRATE (PF) 100 MCG/2ML IJ SOLN
50.0000 ug | Freq: Once | INTRAMUSCULAR | Status: AC
  Administered 2020-03-16: 50 ug via INTRAVENOUS
  Filled 2020-03-16: qty 2

## 2020-03-16 MED ORDER — DOXYCYCLINE HYCLATE 100 MG PO CAPS: 100.0000 mg | ORAL_CAPSULE | Freq: Two times a day (BID) | ORAL | 0 refills | Status: AC

## 2020-03-16 MED ORDER — VANCOMYCIN HCL 1500 MG/300ML IV SOLN
1500.0000 mg | Freq: Once | INTRAVENOUS | Status: AC
  Administered 2020-03-16: 1500 mg via INTRAVENOUS
  Filled 2020-03-16: qty 300

## 2020-03-16 NOTE — ED Triage Notes (Signed)
Reports left upper thigh abscess near groin X 1 week; has been taking ABX without improvement.

## 2020-03-16 NOTE — ED Provider Notes (Signed)
[facility] Emergency Department Provider Note  ____________________________________________   Event Date/Time   First MD Initiated Contact with Patient 03/16/20 1214     (approximate)  I have reviewed the triage vital signs and the nursing notes.   HISTORY  Chief Complaint Abscess   HPI Jenna Huber is a 35 y.o. 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  Patient states it initially came up about a month ago and was lanced and seem to be improving.  She states she recently completed a course of clindamycin while this initially seemed to be helping the abscess stopped draining and has since gotten bigger more painful and swollen.  She denies any extension into her rectal area, tissue between her vagina and rectum or into her vagina.  She denies any other acute sick symptoms including fevers, chills, cough, shortness of breath, headache, earache, sore throat, abdominal pain, back pain, rash or extremity pain.  Denies any illegal drug use or regular EtOH use.  States she thinks he needs to have it opened up because pus is starting accumulate again.  No other clear alleviating or grading factors.         Past Medical History:  Diagnosis Date  . Anxiety   . Asthma   . BRCA negative 10/2019   MyRisk neg  . Depressive disorder   . Family history of breast cancer   . Increased risk of breast cancer 10/2019   IBIS=20.3%/riskscore=14.2%  . Sciatica   . Seizure [facility])     Patient Active Problem List   Diagnosis Date Noted  . Elevated blood sugar 10/04/2019  . Family history of breast cancer 10/04/2019    Past Surgical History:  Procedure Laterality Date  . WISDOM TOOTH EXTRACTION      Prior to Admission medications   Medication Sig Start Date End Date  Taking? Authorizing Provider  doxycycline (VIBRAMYCIN) 100 MG capsule Take 1 capsule (100 mg total) by mouth 2 (two) times daily for 10 days. 03/16/20 03/26/20 Yes Lucrezia Starch, MD  acetaminophen (TYLENOL) 325 MG tablet Take 650 mg by mouth every 6 (six) hours as needed.    [provider]  albuterol (PROVENTIL HFA;VENTOLIN HFA) 108 (90 Base) MCG/ACT inhaler Inhale 2 puffs into the lungs every 6 (six) hours as needed.    [provider]  ondansetron (ZOFRAN ODT) 4 MG disintegrating tablet Take 1 tablet (4 mg total) by mouth every 8 (eight) hours as needed for nausea or vomiting. 01/03/20   Marlana Salvage, PA    Allergies Penicillins, Hydrocodone, Tramadol, Hydrocodone-acetaminophen, and Vicodin [hydrocodone-acetaminophen]  Family History  Problem Relation Age of Onset  . Ovarian cancer Maternal Grandmother        not sure of age  . Breast cancer Maternal Grandmother        70s, with bilat recurrence at a later time  . Diabetes Maternal Grandmother   . Cervical cancer Maternal Grandmother     Social History Social History   Tobacco Use  . Smoking status: Current Every Day Smoker    Packs/day: 0.50    Years: 3.00    Pack years: 1.50    Types: Cigarettes  . Smokeless tobacco: Never Used  Vaping Use  . Vaping Use: Never used  Substance Use Topics  . Alcohol use: Not Currently  Comment: rarely  . Drug use: No    Review of Systems  Review of Systems  Constitutional: Negative for chills and fever.  HENT: Negative for sore throat.   Eyes: Negative for pain.  Respiratory: Negative for cough and stridor.   Cardiovascular: Negative for chest pain.  Gastrointestinal: Negative for vomiting.  Genitourinary: Negative for dysuria.  Musculoskeletal: Positive for myalgias ( L groint ).  Skin: Negative for rash.  Neurological: Negative for seizures, loss of consciousness and headaches.  Psychiatric/Behavioral: Negative for suicidal ideas.  All other systems  reviewed and are negative.     ____________________________________________   PHYSICAL EXAM:  VITAL SIGNS: ED Triage Vitals  Enc Vitals Group     BP 03/16/20 1156 107/71     Pulse Rate 03/16/20 1156 81     Resp 03/16/20 1156 18     Temp 03/16/20 1156 98.2 F (36.8 C)     Temp Source 03/16/20 1156 Oral     SpO2 03/16/20 1156 100 %     Weight 03/16/20 1157 147 lb 11.3 oz (67 kg)     Height 03/16/20 1157 _0  (1.549 m)     Head Circumference --      Peak Flow --      Pain Score 03/16/20 1157 10     Pain Loc --      Pain Edu? --      Excl. in [place]? --    Vitals:   03/16/20 1156  BP: 107/71  Pulse: 81  Resp: 18  Temp: 98.2 F (36.8 C)  SpO2: 100%   Physical Exam Vitals and nursing note reviewed.  Constitutional:      General: She is not in acute distress.    Appearance: She is well-developed and well-nourished.  HENT:     Head: Normocephalic and atraumatic.     Right Ear: External ear normal.     Left Ear: External ear normal.     Nose: Nose normal.     Mouth/Throat:     Mouth: Mucous membranes are moist.  Eyes:     Conjunctiva/sclera: Conjunctivae normal.  Cardiovascular:     Rate and Rhythm: Normal rate and regular rhythm.     Heart sounds: No murmur heard.   Pulmonary:     Effort: Pulmonary effort is normal. No respiratory distress.     Breath sounds: Normal breath sounds.  Abdominal:     Palpations: Abdomen is soft.     Tenderness: There is no abdominal tenderness.  Musculoskeletal:        General: No edema.     Cervical back: Neck supple.     Right lower leg: No edema.     Left lower leg: No edema.  Skin:    General: Skin is warm and dry.     Capillary Refill: Capillary refill takes less than 2 seconds.  Neurological:     Mental Status: She is alert and oriented to person, place, and time.  Psychiatric:        Mood and Affect: Mood and affect and mood normal.     Approximately 2 cm circular fluctuant erythematous indurated and tender  appearing abscess in the left groin.  No extension towards the perineum rectum or vagina.  Surrounding skin is otherwise unremarkable. ____________________________________________   LABS (all labs ordered are listed, but only abnormal results are displayed)  Labs Reviewed  BASIC METABOLIC PANEL - Abnormal; Notable for the following components:      Result Value   Glucose,  Bld 229 (*)    All other components within normal limits  CBC WITH DIFFERENTIAL/PLATELET - Abnormal; Notable for the following components:   WBC 11.3 (*)    All other components within normal limits  AEROBIC CULTURE (SUPERFICIAL SPECIMEN)   ____________________________________________   ____________________________________________   PROCEDURES  Procedure(s) performed (including Critical Care):  Marland KitchenMarland KitchenIncision and Drainage  Date/Time: 03/16/2020 1:31 PM Performed by: Lucrezia Starch, MD Authorized by: Lucrezia Starch, MD   Consent:    Consent obtained:  Verbal   Consent given by:  Patient   Risks discussed:  Bleeding, incomplete drainage, pain and infection   Alternatives discussed:  No treatment and delayed treatment Universal protocol:    Procedure explained and questions answered to patient or proxy's satisfaction: yes     Patient identity confirmed:  Verbally with patient Location:    Type:  Abscess   Size:  2   Location:  Lower extremity   Lower extremity location:  Leg   Leg location:  L upper leg Pre-procedure details:    Skin preparation:  Antiseptic wash Anesthesia:    Anesthesia method:  Local infiltration   Local anesthetic:  Lidocaine 2% WITH epi Procedure type:    Complexity:  Simple Procedure details:    Ultrasound guidance: no     Needle aspiration: no     Incision types:  Single straight   Incision depth:  Dermal   Wound management:  Probed and deloculated and irrigated with saline   Drainage:  Purulent   Drainage amount:  Copious   Wound treatment:  Wound left open    Packing materials:  1/4 in gauze Post-procedure details:    Procedure completion:  Tolerated well, no immediate complications     ____________________________________________   INITIAL IMPRESSION / ASSESSMENT AND PLAN / ED COURSE        Patient presents with left history exam for assessment of recurrence of abscess in the left groin that was previously I indeed have been improving but is not getting worse.  Patient does have a clear abscess on exam in the left groin.  She is afebrile hemodynamically stable arrival  BMP shows glucose of 229 consistent with patient's known history of poorly controlled diabetes without evidence of acidosis or DKA or other significant electrolyte or metabolic derangement.  CBC shows slight leukocytosis with WC of 11.3 with no other significant derangements.  No history or exam findings suggest trauma.  Do not believe patient is septic at this time.  I&D performed per procedure note above.  Given this is recurrence in the same spot with history of MRSA patient is a poorly controlled diabetic and smoker she is at high risk for wound recurrence and complication.  She was given 1 dose of IV Lasix in the emergency room.  Wound culture sent.  Will change antibiotics to Doxy.  Packing left in place.  Advised close follow-up with PCP for wound recheck in 5 to 7 days.  Discharged stable condition.  Strict return precautions advised and discussed.  Patient counseled on importance of tobacco cessation.   ____________________________________________   FINAL CLINICAL IMPRESSION(S) / ED DIAGNOSES  Final diagnoses:  Abscess of groin, left  Hyperglycemia  Encounter for tobacco use cessation counseling    Medications  vancomycin (VANCOREADY) IVPB 1500 mg/300 mL (has no administration in time range)  lidocaine-EPINEPHrine (XYLOCAINE W/EPI) 2 %-1:100000 (with pres) injection 20 mL (20 mLs Intradermal Given 03/16/20 1231)  fentaNYL (SUBLIMAZE) injection 50 mcg (50 mcg  Intravenous Given 03/16/20  1251)     ED Discharge Orders         Ordered    doxycycline (VIBRAMYCIN) 100 MG capsule  2 times daily        03/16/20 1330           Note:  This document was prepared using Dragon voice recognition software and may include unintentional dictation errors.   Lucrezia Starch, MD 03/16/20 [phone]

## 2020-03-16 NOTE — Consult Note (Signed)
PHARMACY -  BRIEF ANTIBIOTIC NOTE   Pharmacy has received consult(s) for vancomycin from an ED provider.  The patient's profile has been reviewed for ht/wt/allergies/indication/available labs.   Per chart review, patient with history of diabetes presenting to the ED with left upper thigh abscess. Patient recently seen in ED 1/6 for the same and discharged with clindamycin.   One time order(s) placed for --Vancomycin 1500 mg IV (22 mg/kg)   Further antibiotics/pharmacy consults should be ordered by admitting physician if indicated.                       Thank you, Tressie Ellis 03/16/2020  12:29 PM

## 2020-03-27 LAB — AEROBIC CULTURE W GRAM STAIN (SUPERFICIAL SPECIMEN)

## 2020-03-27 LAB — SUSCEPTIBILITY RESULT

## 2020-03-27 LAB — SUSCEPTIBILITY, AER + ANAEROB

## 2020-05-02 ENCOUNTER — Ambulatory Visit (INDEPENDENT_AMBULATORY_CARE_PROVIDER_SITE_OTHER): Payer: BC Managed Care – PPO | Admitting: Obstetrics and Gynecology

## 2020-05-02 ENCOUNTER — Other Ambulatory Visit: Payer: Self-pay

## 2020-05-02 ENCOUNTER — Encounter: Payer: Self-pay | Admitting: Obstetrics and Gynecology

## 2020-05-02 ENCOUNTER — Other Ambulatory Visit (HOSPITAL_COMMUNITY)
Admission: RE | Admit: 2020-05-02 | Discharge: 2020-05-02 | Disposition: A | Discharge status: Home / Self Care | Payer: Self-pay | Source: Ambulatory Visit | Attending: Obstetrics and Gynecology | Admitting: Obstetrics and Gynecology

## 2020-05-02 VITALS — BP 98/60 | Ht 64.0 in | Wt 126.0 lb

## 2020-05-02 DIAGNOSIS — R102 Pelvic and perineal pain unspecified side: Secondary | ICD-10-CM

## 2020-05-02 DIAGNOSIS — Z1151 Encounter for screening for human papillomavirus (HPV): Secondary | ICD-10-CM | POA: Diagnosis not present

## 2020-05-02 DIAGNOSIS — N941 Unspecified dyspareunia: Secondary | ICD-10-CM

## 2020-05-02 DIAGNOSIS — Z124 Encounter for screening for malignant neoplasm of cervix: Secondary | ICD-10-CM

## 2020-05-02 DIAGNOSIS — N946 Dysmenorrhea, unspecified: Secondary | ICD-10-CM

## 2020-05-02 DIAGNOSIS — Z803 Family history of malignant neoplasm of breast: Secondary | ICD-10-CM

## 2020-05-02 DIAGNOSIS — Z01419 Encounter for gynecological examination (general) (routine) without abnormal findings: Secondary | ICD-10-CM

## 2020-05-02 DIAGNOSIS — Z9189 Other specified personal risk factors, not elsewhere classified: Secondary | ICD-10-CM

## 2020-05-02 NOTE — Progress Notes (Signed)
PCP:  [facility], [facility]   Chief Complaint  Patient presents with  . Gynecologic Exam    Pressure and pain during intercourse x 2 months     HPI:      [name] is a 35 y.o. G0P0000 whose LMP was No LMP recorded., presents today for her annual examination.  Her menses are regular every 28-30 days, lasting 3-4 days.  Dysmenorrhea mod to severe, not improved with NSAIDs/heating pad. Has missed work/activities in past. She does not have intermenstrual bleeding. Hx of ovar cysts in past, 1 ruptured. Also having non-menstrual dull, throbbing pain before and after menses for a few months.   Sex activity: has sex with females. Having occas stabbing pains/ ressure during sex. No bleeding. Sx for a few months.  Last Pap: not recent Hx of STDs: none  Still gets frequent yeast vag due to DM, but now on insulin with better control. No sx today.  Last mammogram: never; ordered 9/21 but not done yet There is a has FH breast cancer in her MGM, initially in her 5s that came back in both breasts at a later time. Also with ovarian cancer. Pt is adopted and doesn't know pat side. Pt is MyRisk neg 8/21.  IBIS=20.3%/riskscore=14.2%. The patient does do self-breast exams.  Tobacco use: 1 ppd, not ready to quit  Alcohol use: social drinker Daily marijuana use.  Exercise: moderately active  She does get adequate calcium but not Vitamin D in her diet.   Past Medical History:  Diagnosis Date  . Anxiety   . Asthma   . BRCA negative 10/2019   MyRisk neg  . Depressive disorder   . Family history of breast cancer   . Increased risk of breast cancer 10/2019   IBIS=20.3%/riskscore=14.2%  . Sciatica   . Seizure [facility])     Past Surgical History:  Procedure Laterality Date  . WISDOM TOOTH EXTRACTION      Family History  Problem Relation Age of Onset  . Ovarian cancer Maternal Grandmother        not sure of age  . Breast cancer Maternal Grandmother        41s, with bilat  recurrence at a later time  . Diabetes Maternal Grandmother   . Cervical cancer Maternal Grandmother     Social History   Socioeconomic History  . Marital status: Married    Spouse name: Not on file  . Number of children: Not on file  . Years of education: Not on file  . Highest education level: Not on file  Occupational History  . Not on file  Tobacco Use  . Smoking status: Current Every Day Smoker    Packs/day: 0.50    Years: 3.00    Pack years: 1.50    Types: Cigarettes  . Smokeless tobacco: Never Used  Vaping Use  . Vaping Use: Never used  Substance and Sexual Activity  . Alcohol use: Not Currently    Comment: rarely  . Drug use: No  . Sexual activity: Yes    Birth control/protection: None  Other Topics Concern  . Not on file  Social History Narrative  . Not on file   Social Determinants of Health   Financial Resource Strain: Not on file  Food Insecurity: Not on file  Transportation Needs: Not on file  Physical Activity: Not on file  Stress: Not on file  Social Connections: Not on file  Intimate Partner Violence: Not on file  Current Outpatient Medications:  .  albuterol (PROVENTIL HFA;VENTOLIN HFA) 108 (90 Base) MCG/ACT inhaler, Inhale 2 puffs into the lungs every 6 (six) hours as needed., Disp: , Rfl:  .  B-D ULTRAFINE III SHORT PEN 31G X 8 MM MISC, USE 1 ONCE DAILY FOR LANTUS INJECTIONS, Disp: , Rfl:  .  citalopram (CELEXA) 40 MG tablet, Take by mouth., Disp: , Rfl:  .  LANTUS SOLOSTAR 100 UNIT/ML Solostar Pen, Inject 20 unit subcutaneously once a day  . If FBG in the am is >130 increase by 2 unit for nighttime dose., Disp: , Rfl:  .  metFORMIN (GLUCOPHAGE-XR) 500 MG 24 hr tablet, Take 1,000 mg by mouth 2 (two) times daily., Disp: , Rfl:      ROS:  Review of Systems  Constitutional: Negative for fatigue, fever and unexpected weight change.  Respiratory: Negative for cough, shortness of breath and wheezing.   Cardiovascular: Negative for chest  pain, palpitations and leg swelling.  Gastrointestinal: Negative for blood in stool, constipation, diarrhea, nausea and vomiting.  Endocrine: Negative for cold intolerance, heat intolerance and polyuria.  Genitourinary: Positive for dyspareunia and frequency. Negative for dysuria, flank pain, genital sores, hematuria, menstrual problem, pelvic pain, urgency, vaginal bleeding, vaginal discharge and vaginal pain.  Musculoskeletal: Positive for arthralgias. Negative for back pain, joint swelling and myalgias.  Skin: Negative for rash.  Neurological: Positive for headaches. Negative for dizziness, syncope, light-headedness and numbness.  Hematological: Negative for adenopathy.  Psychiatric/Behavioral: Positive for agitation and dysphoric mood. Negative for confusion, sleep disturbance and suicidal ideas. The patient is not nervous/anxious.   BREAST: No symptoms   Objective: BP 98/60   Ht '5\' 4"'  (1.626 m)   Wt 126 lb (57.2 kg)   BMI 21.63 kg/m    Physical Exam Constitutional:      Appearance: She is well-developed.  Genitourinary:     Vulva normal.     Right Labia: No rash, tenderness or lesions.    Left Labia: No tenderness, lesions or rash.    No vaginal discharge, erythema or tenderness.      Right Adnexa: tender.    Right Adnexa: no mass present.    Left Adnexa: not tender and no mass present.    No cervical friability or polyp.     Uterus is not enlarged or tender.  Breasts:     Right: No mass, nipple discharge, skin change or tenderness.     Left: No mass, nipple discharge, skin change or tenderness.    Neck:     Thyroid: No thyromegaly.  Cardiovascular:     Rate and Rhythm: Normal rate and regular rhythm.     Heart sounds: Normal heart sounds. No murmur heard.   Pulmonary:     Effort: Pulmonary effort is normal.     Breath sounds: Normal breath sounds.  Abdominal:     Palpations: Abdomen is soft.     Tenderness: There is abdominal tenderness in the right lower  quadrant. There is guarding. There is no rebound.  Musculoskeletal:        General: Normal range of motion.     Cervical back: Normal range of motion.  Lymphadenopathy:     Cervical: No cervical adenopathy.  Neurological:     General: No focal deficit present.     Mental Status: She is alert and oriented to person, place, and time.     Cranial Nerves: No cranial nerve deficit.  Skin:    General: Skin is warm and dry.  Psychiatric:  Mood and Affect: Mood normal.        Behavior: Behavior normal.        Thought Content: Thought content normal.        Judgment: Judgment normal.  Vitals reviewed.     Assessment/Plan: Encounter for annual routine gynecological examination  Cervical cancer screening - Plan: Cytology - PAP  Screening for HPV (human papillomavirus) - Plan: Cytology - PAP  Dysmenorrhea--discussed BC to improve sx. Cont NSAIDs/heating pad, Check GYN u/s.   Family history of breast cancer--Pt is MyRisk neg  Increased risk of breast cancer--pt aware of monthly SBE, yearly CBE and mammos. Pt to sched mammo, order already placed.   Pelvic pain - Plan: US PELVIS TRANSVAGINAL NON-OB (TV ONLY); tender on exam. Check GYN u/s. Will call with results. Hx of ovar cysts  Dyspareunia in female - Plan: US PELVIS TRANSVAGINAL NON-OB (TV ONLY)   GYN counsel breast self exam, mammography screening, adequate intake of calcium and vitamin D, diet and exercise     F/U  Return in about 1 week (around 05/09/2020) for GYN u/s for RLQ pain--first available, not urgent--[facility] to call pt.  Kenai Fluegel B. Jazilyn Siegenthaler, PA-C 05/02/2020 3:02 PM

## 2020-05-02 NOTE — Patient Instructions (Signed)
I value your feedback and you entrusting us with your care. If you get a [place] patient survey, I would appreciate you taking the time to let us know about your experience today. Thank you! ? ? ?

## 2020-05-08 LAB — CYTOLOGY - PAP
Adequacy: ABSENT
Comment: NEGATIVE
Diagnosis: NEGATIVE
High risk HPV: NEGATIVE

## 2020-05-18 ENCOUNTER — Ambulatory Visit: Payer: Self-pay

## 2020-05-23 DIAGNOSIS — E785 Hyperlipidemia, unspecified: Secondary | ICD-10-CM | POA: Diagnosis not present

## 2020-05-23 DIAGNOSIS — N9412 Deep dyspareunia: Secondary | ICD-10-CM | POA: Diagnosis not present

## 2020-05-23 DIAGNOSIS — E1165 Type 2 diabetes mellitus with hyperglycemia: Secondary | ICD-10-CM | POA: Diagnosis not present

## 2020-08-15 IMAGING — CT CT ABD-PELV W/ CM
2 of 4 series · 16 of 46 positions shown, 18 images · IV contrast (APPLIED)
Comparison: 10/12/2015 CT abdomen/pelvis.

CLINICAL DATA: Right lower quadrant and right flank pain.

EXAM:
CT ABDOMEN AND PELVIS WITH CONTRAST
TECHNIQUE: Multidetector CT imaging of the abdomen and pelvis was performed
using the standard protocol following bolus administration of
intravenous contrast.
CONTRAST:  100mL OMNIPAQUE IOHEXOL 300 MG/ML  SOLN

[Series 2: routine abd/pel with · axial · 0.78mm/px · z∈[-487,-82]mm · 13 of 89 slices shown, 15 images]
[im 4/89  soft-tissue]
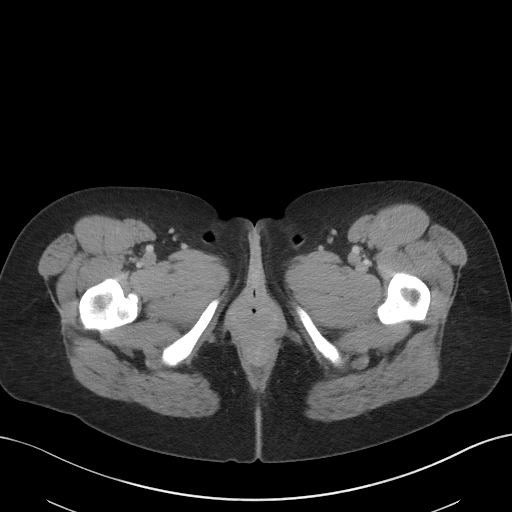
[im 4/89  bone]
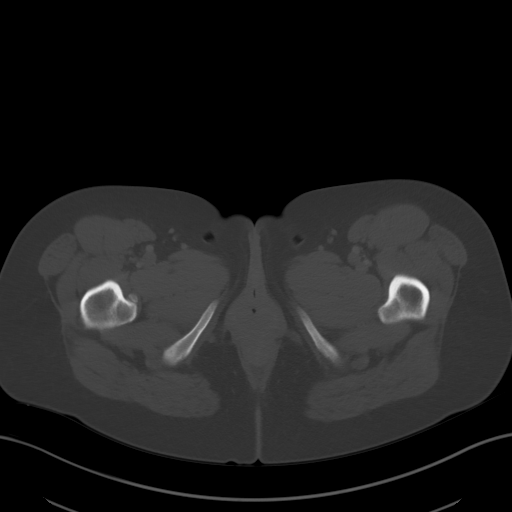
[im 12/89  soft-tissue]
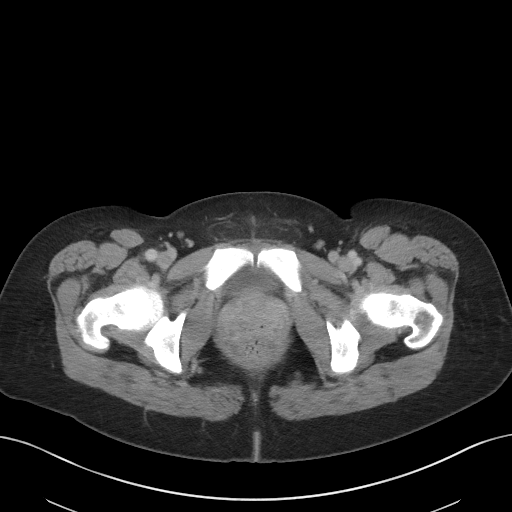
[im 19/89  soft-tissue]
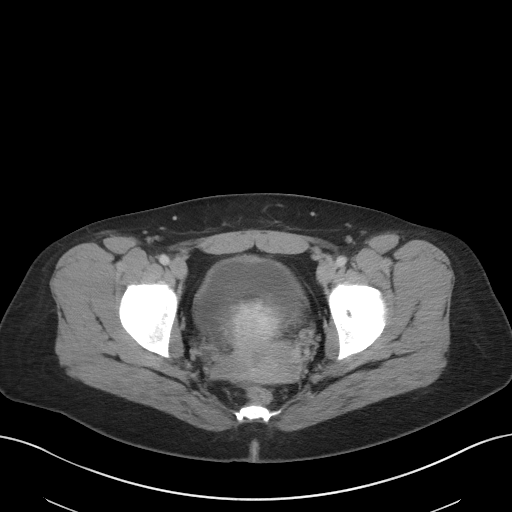
[im 26/89  soft-tissue]
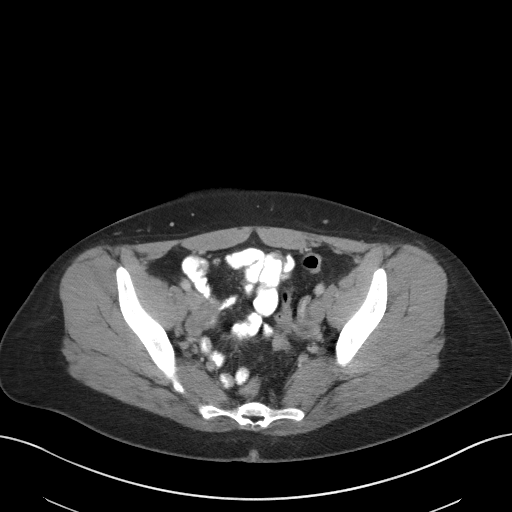
[im 30/89  soft-tissue]
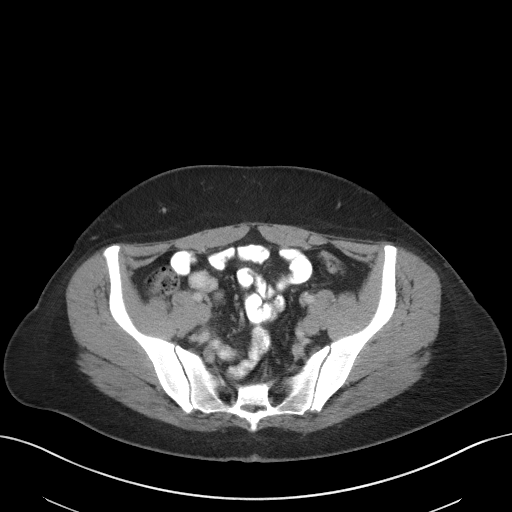
[im 37/89  soft-tissue]
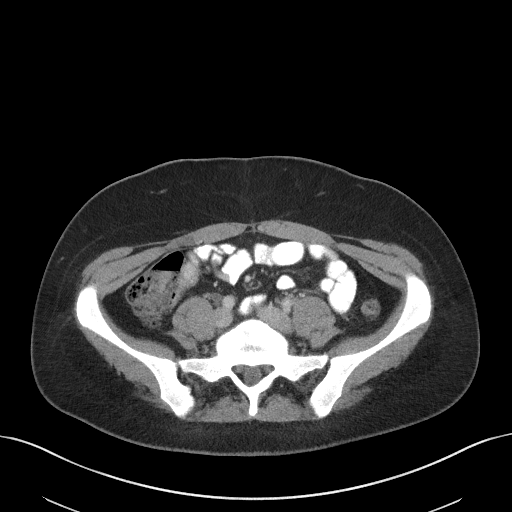
[im 45/89  soft-tissue]
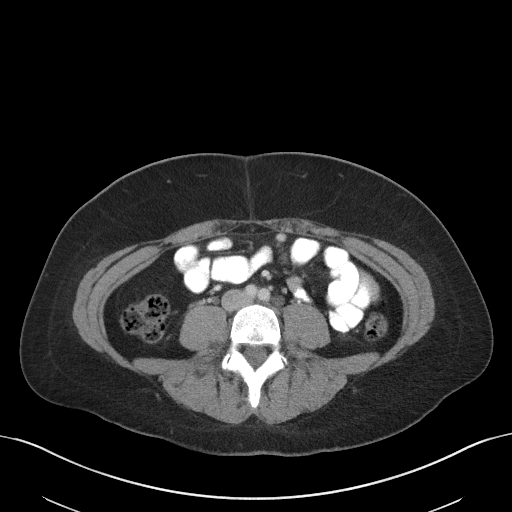
[im 52/89  soft-tissue]
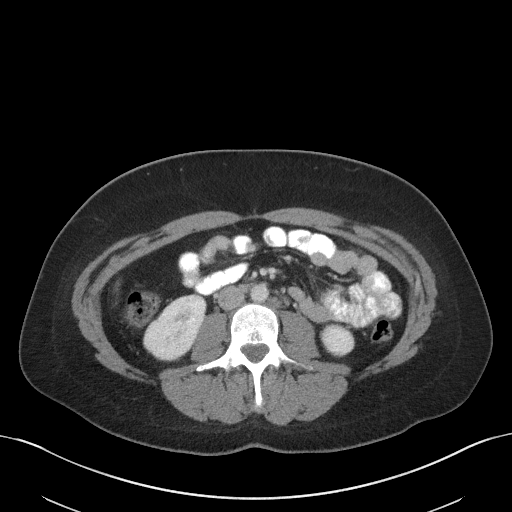
[im 59/89  soft-tissue]
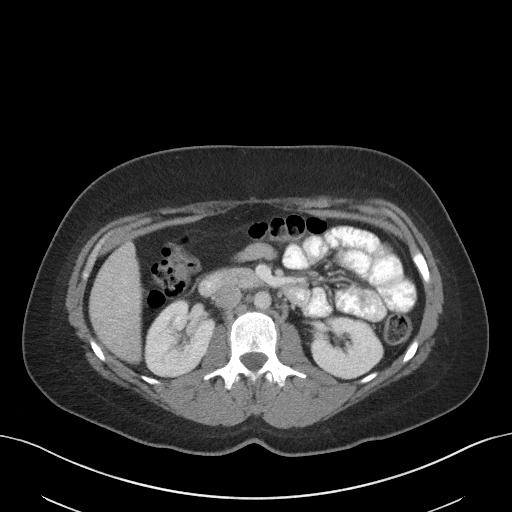
[im 59/89  bone]
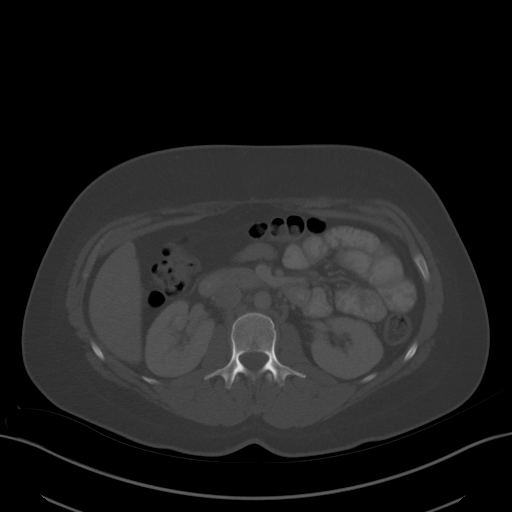
[im 63/89  soft-tissue]
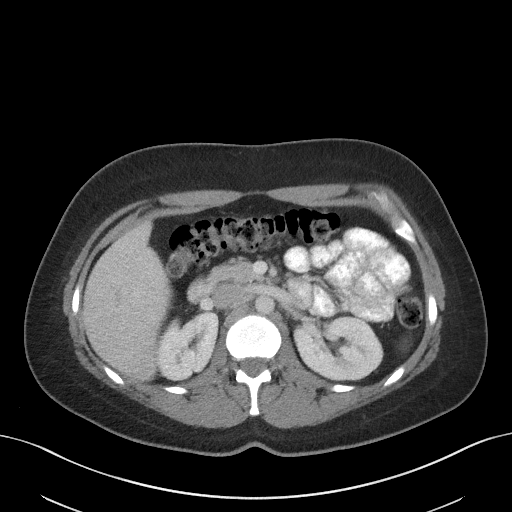
[im 70/89  soft-tissue]
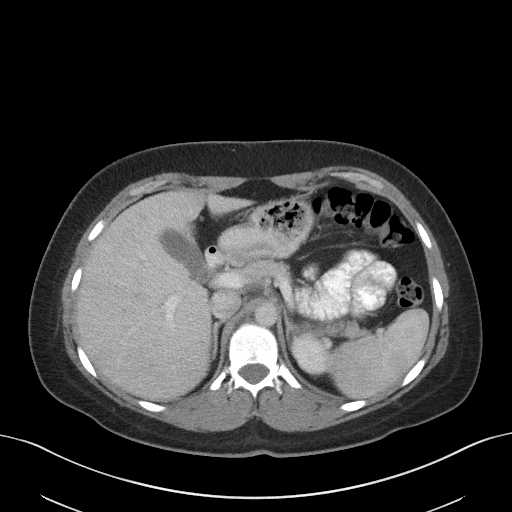
[im 78/89  soft-tissue]
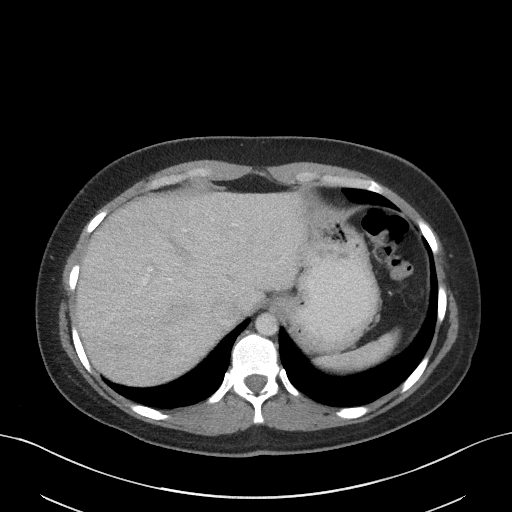
[im 85/89  soft-tissue]
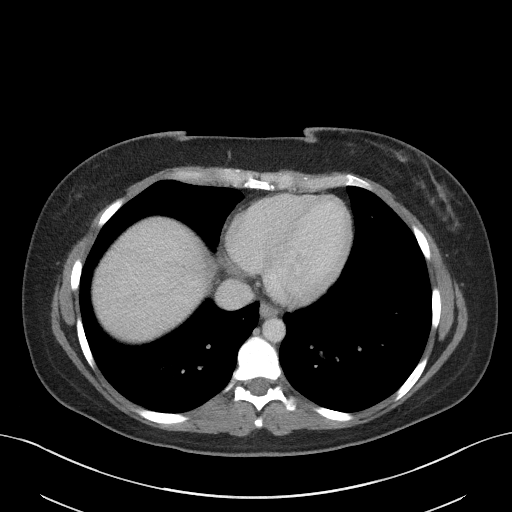

[Series 5: coronal st · coronal · 0.70mm/px · 3 of 90 slices shown]
[im 30/90  soft-tissue]
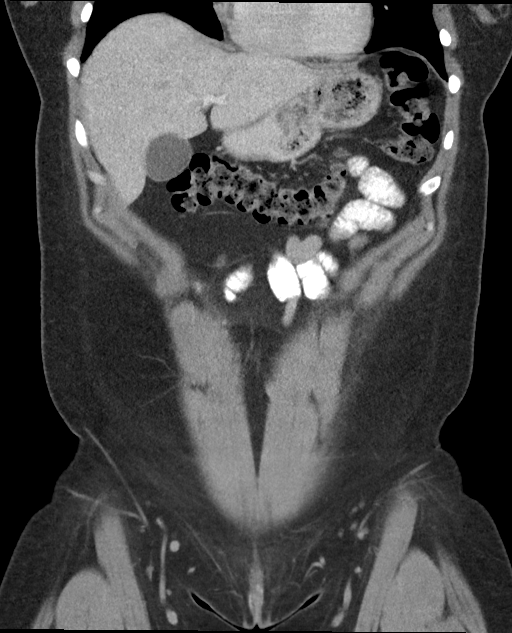
[im 40/90  soft-tissue]
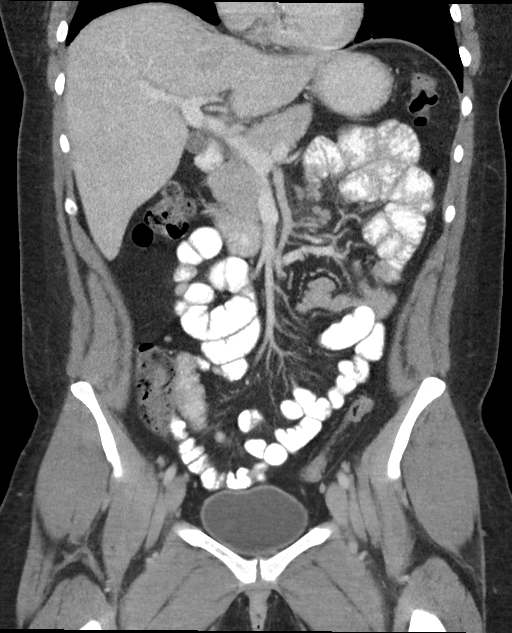
[im 50/90  soft-tissue]
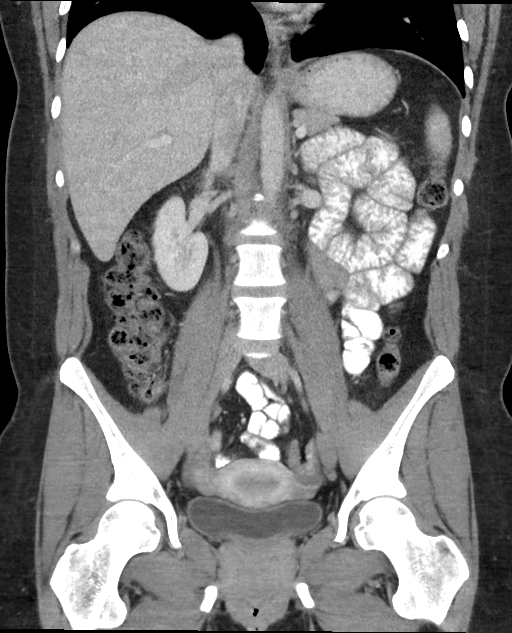

[16 of 46 positions shown; findings below may reference images not displayed]

FINDINGS: Lower chest: New solid 3 mm basilar left lower lobe pulmonary nodule
(series 4/image 10).

Hepatobiliary: Normal liver size. No liver mass. Normal gallbladder
with no radiopaque cholelithiasis. No biliary ductal dilatation.

Pancreas: Normal, with no mass or duct dilation.

Spleen: Normal size spleen. Subcentimeter hypodense inferior splenic
lesion is unchanged and considered benign. No additional splenic
lesions.

Adrenals/Urinary Tract: Normal adrenals. No hydronephrosis. No
contour deforming renal masses. Normal bladder.

Stomach/Bowel: Normal non-distended stomach. Normal caliber small
bowel with no small bowel wall thickening. Normal appendix. Oral
contrast transits to the cecum. Normal large bowel with no
diverticulosis, large bowel wall thickening or pericolonic fat
stranding.

Vascular/Lymphatic: Normal caliber abdominal aorta. Patent portal,
splenic and renal veins. No pathologically enlarged lymph nodes in
the abdomen or pelvis.

Reproductive: Normal anteverted uterus. Left ovarian 2.4 cm corpus
luteum. Otherwise no adnexal lesions.

Other: No pneumoperitoneum, ascites or focal fluid collection.

Musculoskeletal: No aggressive appearing focal osseous lesions.
Moderate degenerative disc disease at L5-S1.
IMPRESSION: 1. No acute abnormality. No evidence of bowel obstruction or acute
bowel inflammation. Normal appendix. No hydronephrosis.
2. New solid 3 mm left lung base pulmonary nodule. No follow-up
required unless the patient has risk factors for lung malignancy, in
which case a follow-up chest CT may be obtained in 12 months.
3. Left ovarian corpus luteum.

## 2020-09-19 ENCOUNTER — Emergency Department: Payer: Self-pay

## 2020-09-19 ENCOUNTER — Inpatient Hospital Stay
Admission: EM | Admit: 2020-09-19 | Discharge: 2020-09-21 | DRG: 639 | Disposition: A | Discharge status: Home / Self Care | Payer: Self-pay | Attending: Obstetrics and Gynecology | Admitting: Obstetrics and Gynecology

## 2020-09-19 ENCOUNTER — Encounter: Payer: Self-pay | Admitting: Internal Medicine

## 2020-09-19 ENCOUNTER — Other Ambulatory Visit: Payer: Self-pay

## 2020-09-19 DIAGNOSIS — Z79899 Other long term (current) drug therapy: Secondary | ICD-10-CM

## 2020-09-19 DIAGNOSIS — Z7984 Long term (current) use of oral hypoglycemic drugs: Secondary | ICD-10-CM

## 2020-09-19 DIAGNOSIS — T383X6A Underdosing of insulin and oral hypoglycemic [antidiabetic] drugs, initial encounter: Secondary | ICD-10-CM | POA: Diagnosis present

## 2020-09-19 DIAGNOSIS — Z91120 Patient's intentional underdosing of medication regimen due to financial hardship: Secondary | ICD-10-CM

## 2020-09-19 DIAGNOSIS — E1110 Type 2 diabetes mellitus with ketoacidosis without coma: Secondary | ICD-10-CM | POA: Diagnosis present

## 2020-09-19 DIAGNOSIS — E109 Type 1 diabetes mellitus without complications: Secondary | ICD-10-CM

## 2020-09-19 DIAGNOSIS — Z880 Allergy status to penicillin: Secondary | ICD-10-CM

## 2020-09-19 DIAGNOSIS — Z885 Allergy status to narcotic agent status: Secondary | ICD-10-CM

## 2020-09-19 DIAGNOSIS — G40909 Epilepsy, unspecified, not intractable, without status epilepticus: Secondary | ICD-10-CM | POA: Diagnosis present

## 2020-09-19 DIAGNOSIS — F419 Anxiety disorder, unspecified: Secondary | ICD-10-CM | POA: Diagnosis present

## 2020-09-19 DIAGNOSIS — Z803 Family history of malignant neoplasm of breast: Secondary | ICD-10-CM

## 2020-09-19 DIAGNOSIS — J45909 Unspecified asthma, uncomplicated: Secondary | ICD-10-CM | POA: Diagnosis present

## 2020-09-19 DIAGNOSIS — E1010 Type 1 diabetes mellitus with ketoacidosis without coma: Principal | ICD-10-CM | POA: Diagnosis present

## 2020-09-19 DIAGNOSIS — Z833 Family history of diabetes mellitus: Secondary | ICD-10-CM

## 2020-09-19 DIAGNOSIS — Z20822 Contact with and (suspected) exposure to covid-19: Secondary | ICD-10-CM | POA: Diagnosis present

## 2020-09-19 DIAGNOSIS — Z8041 Family history of malignant neoplasm of ovary: Secondary | ICD-10-CM

## 2020-09-19 DIAGNOSIS — F32A Depression, unspecified: Secondary | ICD-10-CM | POA: Diagnosis present

## 2020-09-19 DIAGNOSIS — F17210 Nicotine dependence, cigarettes, uncomplicated: Secondary | ICD-10-CM | POA: Diagnosis present

## 2020-09-19 LAB — BASIC METABOLIC PANEL
Anion gap: 22 — ABNORMAL HIGH (ref 5–15)
Anion gap: 22 — ABNORMAL HIGH (ref 5–15)
Anion gap: 9 (ref 5–15)
BUN: 14 mg/dL (ref 6–20)
BUN: 12 mg/dL (ref 6–20)
BUN: 9 mg/dL (ref 6–20)
CO2: 10 mmol/L — ABNORMAL LOW (ref 22–32)
CO2: 11 mmol/L — ABNORMAL LOW (ref 22–32)
CO2: 15 mmol/L — ABNORMAL LOW (ref 22–32)
Calcium: 9.5 mg/dL (ref 8.9–10.3)
Calcium: 9.8 mg/dL (ref 8.9–10.3)
Calcium: 8.9 mg/dL (ref 8.9–10.3)
Chloride: 101 mmol/L (ref 98–111)
Chloride: 101 mmol/L (ref 98–111)
Chloride: 108 mmol/L (ref 98–111)
Creatinine, Ser: 0.85 mg/dL (ref 0.44–1.00)
Creatinine, Ser: 0.87 mg/dL (ref 0.44–1.00)
Creatinine, Ser: 0.59 mg/dL (ref 0.44–1.00)
GFR, Estimated: >60 mL/min (ref >60)
GFR, Estimated: >60 mL/min (ref >60)
GFR, Estimated: >60 mL/min (ref >60)
Glucose, Bld: 259 mg/dL — ABNORMAL HIGH (ref 70–99)
Glucose, Bld: 242 mg/dL — ABNORMAL HIGH (ref 70–99)
Glucose, Bld: 171 mg/dL — ABNORMAL HIGH (ref 70–99)
Potassium: 4.1 mmol/L (ref 3.5–5.1)
Potassium: 4.5 mmol/L (ref 3.5–5.1)
Potassium: 3.6 mmol/L (ref 3.5–5.1)
Sodium: 133 mmol/L — ABNORMAL LOW (ref 135–145)
Sodium: 134 mmol/L — ABNORMAL LOW (ref 135–145)
Sodium: 132 mmol/L — ABNORMAL LOW (ref 135–145)

## 2020-09-19 LAB — URINALYSIS, COMPLETE (UACMP) WITH MICROSCOPIC
Bilirubin Urine: NEGATIVE
Glucose, UA: >=500 mg/dL — AB
Ketones, ur: 80 mg/dL — AB
Leukocytes,Ua: NEGATIVE
Nitrite: NEGATIVE
Protein, ur: 100 mg/dL — AB
Specific Gravity, Urine: 1.027 (ref 1.005–1.030)
pH: 5 (ref 5.0–8.0)

## 2020-09-19 LAB — GLUCOSE, CAPILLARY
Glucose-Capillary: 189 mg/dL — ABNORMAL HIGH (ref 70–99)
Glucose-Capillary: 191 mg/dL — ABNORMAL HIGH (ref 70–99)
Glucose-Capillary: 183 mg/dL — ABNORMAL HIGH (ref 70–99)

## 2020-09-19 LAB — LACTIC ACID, PLASMA
Lactic Acid, Venous: 2.1 mmol/L (ref 0.5–1.9)
Lactic Acid, Venous: 1.2 mmol/L (ref 0.5–1.9)

## 2020-09-19 LAB — BLOOD GAS, VENOUS
Acid-base deficit: 13.7 mmol/L — ABNORMAL HIGH (ref 0.0–2.0)
Bicarbonate: 10.1 mmol/L — ABNORMAL LOW (ref 20.0–28.0)
O2 Saturation: 92.3 %
Patient temperature: 37
pCO2, Ven: 20 mmHg — ABNORMAL LOW (ref 44.0–60.0)
pH, Ven: 7.31 (ref 7.250–7.430)
pO2, Ven: 71 mmHg — ABNORMAL HIGH (ref 32.0–45.0)

## 2020-09-19 LAB — URINE DRUG SCREEN, QUALITATIVE (ARMC ONLY)
Amphetamines, Ur Screen: NOT DETECTED
Barbiturates, Ur Screen: NOT DETECTED
Benzodiazepine, Ur Scrn: NOT DETECTED
Cannabinoid 50 Ng, Ur ~~LOC~~: POSITIVE — AB
Cocaine Metabolite,Ur ~~LOC~~: NOT DETECTED
MDMA (Ecstasy)Ur Screen: NOT DETECTED
Methadone Scn, Ur: NOT DETECTED
Opiate, Ur Screen: NOT DETECTED
Phencyclidine (PCP) Ur S: NOT DETECTED
Tricyclic, Ur Screen: NOT DETECTED

## 2020-09-19 LAB — CBC
HCT: 49.3 % — ABNORMAL HIGH (ref 36.0–46.0)
Hemoglobin: 16.2 g/dL — ABNORMAL HIGH (ref 12.0–15.0)
MCH: 29.3 pg (ref 26.0–34.0)
MCHC: 32.9 g/dL (ref 30.0–36.0)
MCV: 89.2 fL (ref 80.0–100.0)
Platelets: 388 10*3/uL (ref 150–400)
RBC: 5.53 MIL/uL — ABNORMAL HIGH (ref 3.87–5.11)
RDW: 12.8 % (ref 11.5–15.5)
WBC: 16 10*3/uL — ABNORMAL HIGH (ref 4.0–10.5)
nRBC: 0 % (ref 0.0–0.2)

## 2020-09-19 LAB — PHOSPHORUS: Phosphorus: 1.7 mg/dL — ABNORMAL LOW (ref 2.5–4.6)

## 2020-09-19 LAB — HEPATIC FUNCTION PANEL
ALT: 9 U/L (ref 0–44)
AST: 15 U/L (ref 15–41)
Albumin: 5 g/dL (ref 3.5–5.0)
Alkaline Phosphatase: 70 U/L (ref 38–126)
Bilirubin, Direct: <0.1 mg/dL (ref 0.0–0.2)
Total Bilirubin: 1.6 mg/dL — ABNORMAL HIGH (ref 0.3–1.2)
Total Protein: 8.3 g/dL — ABNORMAL HIGH (ref 6.5–8.1)

## 2020-09-19 LAB — HCG, QUANTITATIVE, PREGNANCY: hCG, Beta Chain, Quant, S: <1 m[IU]/mL (ref <5)

## 2020-09-19 LAB — TSH: TSH: 2.222 u[IU]/mL (ref 0.350–4.500)

## 2020-09-19 LAB — BETA-HYDROXYBUTYRIC ACID: Beta-Hydroxybutyric Acid: >8 mmol/L — ABNORMAL HIGH (ref 0.05–0.27)

## 2020-09-19 LAB — RESP PANEL BY RT-PCR (FLU A&B, COVID) ARPGX2
Influenza A by PCR: NEGATIVE
Influenza B by PCR: NEGATIVE
SARS Coronavirus 2 by RT PCR: NEGATIVE

## 2020-09-19 LAB — TROPONIN I (HIGH SENSITIVITY)
Troponin I (High Sensitivity): 4 ng/L (ref <18)
Troponin I (High Sensitivity): 5 ng/L (ref <18)

## 2020-09-19 LAB — MAGNESIUM: Magnesium: 2 mg/dL (ref 1.7–2.4)

## 2020-09-19 LAB — MRSA NEXT GEN BY PCR, NASAL: MRSA by PCR Next Gen: NOT DETECTED

## 2020-09-19 LAB — CBG MONITORING, ED: Glucose-Capillary: 170 mg/dL — ABNORMAL HIGH (ref 70–99)

## 2020-09-19 MED ORDER — INSULIN ASPART 100 UNIT/ML IJ SOLN
0.0000 [IU] | Freq: Every day | INTRAMUSCULAR | Status: DC
Start: 2020-09-20 — End: 2020-09-20

## 2020-09-19 MED ORDER — DOCUSATE SODIUM 100 MG PO CAPS: 100.0000 mg | ORAL_CAPSULE | Freq: Two times a day (BID) | ORAL | Status: DC | PRN

## 2020-09-19 MED ORDER — POLYETHYLENE GLYCOL 3350 17 G PO PACK: 17.0000 g | PACK | Freq: Every day | ORAL | Status: DC | PRN

## 2020-09-19 MED ORDER — INSULIN ASPART 100 UNIT/ML IJ SOLN
0.0000 [IU] | Freq: Three times a day (TID) | INTRAMUSCULAR | Status: DC
  Administered 2020-09-20: 3 [IU] via SUBCUTANEOUS
  Filled 2020-09-19: qty 1

## 2020-09-19 MED ORDER — DEXTROSE IN LACTATED RINGERS 5 % IV SOLN: INTRAVENOUS | Status: DC

## 2020-09-19 MED ORDER — INSULIN REGULAR(HUMAN) IN NACL 100-0.9 UT/100ML-% IV SOLN
INTRAVENOUS | Status: DC
  Administered 2020-09-19: 9 [IU]/h via INTRAVENOUS
  Filled 2020-09-19: qty 100

## 2020-09-19 MED ORDER — POTASSIUM CHLORIDE 10 MEQ/100ML IV SOLN
10.0000 meq | INTRAVENOUS | Status: AC
Start: 2020-09-19 — End: 2020-09-19
  Administered 2020-09-19: 10 meq via INTRAVENOUS

## 2020-09-19 MED ORDER — ONDANSETRON HCL 4 MG/2ML IJ SOLN
4.0000 mg | Freq: Once | INTRAMUSCULAR | Status: AC
  Administered 2020-09-19: 4 mg via INTRAVENOUS
  Filled 2020-09-19: qty 2

## 2020-09-19 MED ORDER — LACTATED RINGERS IV BOLUS
1000.0000 mL | Freq: Once | INTRAVENOUS | Status: AC
  Administered 2020-09-19: 1000 mL via INTRAVENOUS

## 2020-09-19 MED ORDER — CHLORHEXIDINE GLUCONATE CLOTH 2 % EX PADS
6.0000 | MEDICATED_PAD | Freq: Every day | CUTANEOUS | Status: DC
  Administered 2020-09-19 – 2020-09-20 (×2): 6 via TOPICAL

## 2020-09-19 MED ORDER — CITALOPRAM HYDROBROMIDE 20 MG PO TABS
40.0000 mg | ORAL_TABLET | Freq: Every day | ORAL | Status: DC
  Administered 2020-09-19 – 2020-09-21 (×3): 40 mg via ORAL
  Filled 2020-09-19 (×3): qty 2

## 2020-09-19 MED ORDER — DEXTROSE 50 % IV SOLN: 0.0000 mL | INTRAVENOUS | Status: DC | PRN

## 2020-09-19 MED ORDER — ALBUTEROL SULFATE HFA 108 (90 BASE) MCG/ACT IN AERS: 2.0000 | INHALATION_SPRAY | Freq: Four times a day (QID) | RESPIRATORY_TRACT | Status: DC | PRN

## 2020-09-19 MED ORDER — INSULIN GLARGINE 100 UNIT/ML ~~LOC~~ SOLN
10.0000 [IU] | Freq: Every day | SUBCUTANEOUS | Status: DC
  Administered 2020-09-20: 10 [IU] via SUBCUTANEOUS
  Filled 2020-09-19 (×2): qty 0.1

## 2020-09-19 MED ORDER — ALBUTEROL SULFATE (2.5 MG/3ML) 0.083% IN NEBU: 2.5000 mg | INHALATION_SOLUTION | Freq: Four times a day (QID) | RESPIRATORY_TRACT | Status: DC | PRN

## 2020-09-19 MED ORDER — ONDANSETRON HCL 4 MG/2ML IJ SOLN: 4.0000 mg | Freq: Four times a day (QID) | INTRAMUSCULAR | Status: DC | PRN

## 2020-09-19 MED ORDER — LACTATED RINGERS IV SOLN: INTRAVENOUS | Status: DC

## 2020-09-19 MED ORDER — INSULIN ASPART 100 UNIT/ML IJ SOLN
4.0000 [IU] | Freq: Three times a day (TID) | INTRAMUSCULAR | Status: DC
  Administered 2020-09-20: 4 [IU] via SUBCUTANEOUS
  Filled 2020-09-19: qty 1

## 2020-09-19 MED ORDER — ENOXAPARIN SODIUM 40 MG/0.4ML IJ SOSY
40.0000 mg | PREFILLED_SYRINGE | INTRAMUSCULAR | Status: DC
  Administered 2020-09-19: 40 mg via SUBCUTANEOUS
  Filled 2020-09-19: qty 0.4

## 2020-09-19 NOTE — ED Provider Notes (Signed)
[facility] Emergency Department Provider Note  ____________________________________________   Event Date/Time   First MD Initiated Contact with Patient 09/19/20 1741     (approximate)  I have reviewed the triage vital signs and the nursing notes.   HISTORY  Chief Complaint Palpitations   HPI Jenna Huber is a 35 y.o. female past medical history of eczema, anxiety, depression, sciatica and seizure disorder not currently on AEDs or having any seizures in over a year as well as recent diagnosis of type 1 diabetes currently on sliding scale insulin at home who presents for assessment of for 5 days of some shortness of breath, dizziness, increased thirst, increased urination, fatigue and nausea.  She denies any headache, earache, sore throat, cough fevers, back pain, burning with urination, diarrhea, constipation, rash or extremity pain.  No recent falls or injuries.  She denies EtOH or illicit drug use aside from some THC use occasionally.  She does not endorse tobacco abuse.  She has never been in DKA before.  No other acute concerns at this time.         Past Medical History:  Diagnosis Date   Anxiety    Asthma    BRCA negative 10/2019   MyRisk neg   Depressive disorder    Family history of breast cancer    Increased risk of breast cancer 10/2019   IBIS=20.3%/riskscore=14.2%   Sciatica    Seizure [facility])     Patient Active Problem List   Diagnosis Date Noted   Diabetic ketoacidosis ([place]) 09/19/2020   Dysmenorrhea 05/02/2020   Increased risk of breast cancer 05/02/2020   Elevated blood sugar 10/04/2019   Family history of breast cancer 10/04/2019    Past Surgical History:  Procedure Laterality Date   WISDOM TOOTH EXTRACTION      Prior to Admission medications   Medication Sig Start Date End Date Taking? Authorizing Provider  albuterol (PROVENTIL HFA;VENTOLIN HFA) 108 (90 Base) MCG/ACT inhaler Inhale 2 puffs into the lungs every 6 (six)  hours as needed.    [provider]  B-D ULTRAFINE III SHORT PEN 31G X 8 MM MISC USE 1 ONCE DAILY FOR LANTUS INJECTIONS 04/25/20   [provider]  citalopram (CELEXA) 40 MG tablet Take by mouth. 01/12/20   [provider]  LANTUS SOLOSTAR 100 UNIT/ML Solostar Pen Inject 20 unit subcutaneously once a day  . If FBG in the am is >130 increase by 2 unit for nighttime dose. 04/24/20   [provider]  metFORMIN (GLUCOPHAGE-XR) 500 MG 24 hr tablet Take 1,000 mg by mouth 2 (two) times daily. 03/09/20   [provider]    Allergies Penicillins, Hydrocodone, Tramadol, Hydrocodone-acetaminophen, and Vicodin [hydrocodone-acetaminophen]  Family History  Problem Relation Age of Onset   Ovarian cancer Maternal Grandmother        not sure of age   Breast cancer Maternal Grandmother        39s, with bilat recurrence at a later time   Diabetes Maternal Grandmother    Cervical cancer Maternal Grandmother     Social History Social History   Tobacco Use   Smoking status: Every Day    Packs/day: 0.50    Years: 3.00    Pack years: 1.50    Types: Cigarettes   Smokeless tobacco: Never  Vaping Use   Vaping Use: Never used  Substance Use Topics   Alcohol use: Not Currently    Comment: rarely   Drug use: No  Review of Systems  Review of Systems  Constitutional:  Positive for malaise/fatigue. Negative for chills and fever.  HENT:  Negative for sore throat.   Eyes:  Negative for pain.  Respiratory:  Positive for shortness of breath. Negative for cough and stridor.   Cardiovascular:  Negative for chest pain.  Gastrointestinal:  Positive for nausea. Negative for vomiting.  Genitourinary:  Positive for frequency.  Musculoskeletal:  Negative for myalgias.  Skin:  Negative for rash.  Neurological:  Positive for weakness. Negative for seizures, loss of consciousness and headaches.  Endo/Heme/Allergies:  Positive for polydipsia.  Psychiatric/Behavioral:   Negative for suicidal ideas.   All other systems reviewed and are negative.    ____________________________________________   PHYSICAL EXAM:  VITAL SIGNS: ED Triage Vitals  Enc Vitals Group     BP 09/19/20 1622 137/77     Pulse Rate 09/19/20 1622 84     Resp 09/19/20 1622 18     Temp 09/19/20 1622 97.7 F (36.5 C)     Temp Source 09/19/20 1622 Oral     SpO2 09/19/20 1622 97 %     Weight 09/19/20 1624 123 lb (55.8 kg)     Height 09/19/20 1624 '5\' 4"'  (1.626 m)     Head Circumference --      Peak Flow --      Pain Score 09/19/20 1622 3     Pain Loc --      Pain Edu? --      Excl. in [place]? --    Vitals:   09/19/20 1622 09/19/20 1800  BP: 137/77 133/74  Pulse: 84 84  Resp: 18 17  Temp: 97.7 F (36.5 C)   SpO2: 97% 98%   Physical Exam Vitals and nursing note reviewed.  Constitutional:      General: She is not in acute distress.    Appearance: She is well-developed. She is ill-appearing.  HENT:     Head: Normocephalic and atraumatic.     Right Ear: External ear normal.     Left Ear: External ear normal.     Nose: Nose normal.     Mouth/Throat:     Mouth: Mucous membranes are dry.  Eyes:     Conjunctiva/sclera: Conjunctivae normal.  Cardiovascular:     Rate and Rhythm: Normal rate and regular rhythm.     Heart sounds: No murmur heard. Pulmonary:     Effort: Pulmonary effort is normal. No respiratory distress.     Breath sounds: Normal breath sounds.  Abdominal:     Palpations: Abdomen is soft.     Tenderness: There is no abdominal tenderness.  Musculoskeletal:     Cervical back: Neck supple.  Skin:    General: Skin is warm and dry.     Capillary Refill: Capillary refill takes more than 3 seconds.  Neurological:     Mental Status: She is alert and oriented to person, place, and time.  Psychiatric:        Mood and Affect: Mood normal.     ____________________________________________   LABS (all labs ordered are listed, but only abnormal results are  displayed)  Labs Reviewed  BASIC METABOLIC PANEL - Abnormal; Notable for the following components:      Result Value   Sodium 133 (*)    CO2 10 (*)    Glucose, Bld 259 (*)    Anion gap 22 (*)    All other components within normal limits  CBC - Abnormal; Notable for the following components:  WBC 16.0 (*)    RBC 5.53 (*)    Hemoglobin 16.2 (*)    HCT 49.3 (*)    All other components within normal limits  HEPATIC FUNCTION PANEL - Abnormal; Notable for the following components:   Total Protein 8.3 (*)    Total Bilirubin 1.6 (*)    All other components within normal limits  URINALYSIS, COMPLETE (UACMP) WITH MICROSCOPIC - Abnormal; Notable for the following components:   Color, Urine YELLOW (*)    APPearance HAZY (*)    Glucose, UA >=500 (*)    Hgb urine dipstick SMALL (*)    Ketones, ur 80 (*)    Protein, ur 100 (*)    Bacteria, UA RARE (*)    All other components within normal limits  BLOOD GAS, VENOUS - Abnormal; Notable for the following components:   pH, Ven 7.12 (*)    pCO2, Ven 30 (*)    Bicarbonate 9.8 (*)    Acid-base deficit 18.2 (*)    All other components within normal limits  LACTIC ACID, PLASMA - Abnormal; Notable for the following components:   Lactic Acid, Venous 2.1 (*)    All other components within normal limits  RESP PANEL BY RT-PCR (FLU A&B, COVID) ARPGX2  TSH  BETA-HYDROXYBUTYRIC ACID  LACTIC ACID, PLASMA  BASIC METABOLIC PANEL  BASIC METABOLIC PANEL  BASIC METABOLIC PANEL  HCG, QUANTITATIVE, PREGNANCY  MAGNESIUM  POC URINE PREG, ED  CBG MONITORING, ED  TROPONIN I (HIGH SENSITIVITY)  TROPONIN I (HIGH SENSITIVITY)   ____________________________________________  EKG  Sinus rhythm with a ventricular rate of 88, normal axis, unremarkable intervals with some nonspecific change versus artifact in inferior and lateral leads as well as anterior leads V2 and V3. ____________________________________________  RADIOLOGY  ED MD interpretation: Chest  x-ray shows no clear focal consolidation, large effusion, significant edema, pneumothorax or other clear acute intrathoracic process.  Official radiology report(s): DG Chest 2 View  Result Date: 09/19/2020 CLINICAL DATA:  Chest pain and shortness of breath. dizziness x several days. Patient reports PCP sent her here to be seen. Patient also reports nausea, but denies vomiting or diarrhea. Hx of asthma, EXAM: CHEST - 2 VIEW COMPARISON:  Chest x-ray 07/11/2004 FINDINGS: The heart size and mediastinal contours are within normal limits. Question pericentimeter nodularity overlying the right upper lung zone. No pulmonary edema. No pleural effusion. No pneumothorax. No acute osseous abnormality. IMPRESSION: Question pericentimeter nodularity overlying the right upper lung zone. Finding may related to EKG leads. Correlate with physical exam. Electronically Signed   By: Iven Finn M.D.   On: 09/19/2020 17:18    ____________________________________________   PROCEDURES  Procedure(s) performed (including Critical Care):  .Critical Care  Date/Time: 09/19/2020 5:55 PM Performed by: Lucrezia Starch, MD Authorized by: Lucrezia Starch, MD   Critical care provider statement:    Critical care time (minutes):  45   Critical care was necessary to treat or prevent imminent or life-threatening deterioration of the following conditions:  Metabolic crisis   Critical care was time spent personally by me on the following activities:  Discussions with consultants, evaluation of patient's response to treatment, examination of patient, ordering and performing treatments and interventions, ordering and review of laboratory studies, ordering and review of radiographic studies, pulse oximetry, re-evaluation of patient's condition, obtaining history from patient or surrogate and review of old charts   ____________________________________________   INITIAL IMPRESSION / [place] / ED COURSE       Patient presents with  above-stated history exam for assessment of some shortness of breath associate with some nausea and increased thirst and polyuria.  On arrival she was afebrile and hemodynamically stable although appears quite dehydrated.  Differential includes pneumonia, arrhythmia, symptomatic anemia, metabolic derangements, ACS.  Lower suspicion for PE as patient is PERC negative.  Chest x-ray shows no clear focal consolidation, large effusion, significant edema, pneumothorax or other clear acute intrathoracic process.  ECG shows sinus rhythm with some nonspecific changes but no evidence of arrhythmia.  Troponin is nonelevated 4 not consistent with ACS or myocarditis.  BMP remarkable for bicarb of 10, glucose of 259 and an anion gap of 22.  No other significant derangements.  This concerning for DKA.  CBC shows WBC count of 16, hemoglobin of 16.2 normal platelets.  TSH unremarkable.  Hepatic function panel unremarkable.  UA with ketones protein and rare bacteria but not clearly suggestive of cystitis in the absence of history of dysuria.  Tach acid minimally elevated 2.1.  Suspect secondary to dehydration and do not believe patient is currently septic.  VBG with a pH of 7.12 with a PCO2 of 30 and a bicarb of 9.8.  This fits with picture on BMP concerning for DKA.  Patient started on insulin drip and received a bolus of IV fluids.  Discussed with critical care attending will admit patient.      ____________________________________________   FINAL CLINICAL IMPRESSION(S) / ED DIAGNOSES  Final diagnoses:  Diabetic ketoacidosis without coma associated with type 1 diabetes mellitus ([place])    Medications  insulin regular, human (MYXREDLIN) 100 units/ 100 mL infusion (9 Units/hr Intravenous New Bag/Given 09/19/20 1805)  lactated ringers infusion ( Intravenous New Bag/Given 09/19/20 1807)  dextrose 5 % in lactated ringers infusion (has no administration in time range)  dextrose 50 %  solution 0-50 mL (has no administration in time range)  potassium chloride 10 mEq in 100 mL IVPB (10 mEq Intravenous New Bag/Given 09/19/20 1812)  lactated ringers bolus 1,000 mL (1,000 mLs Intravenous New Bag/Given 09/19/20 1748)  ondansetron (ZOFRAN) injection 4 mg (4 mg Intravenous Given 09/19/20 1807)     ED Discharge Orders     None        Note:  This document was prepared using Dragon voice recognition software and may include unintentional dictation errors.    Lucrezia Starch, MD 09/19/20 [phone]

## 2020-09-19 NOTE — ED Provider Notes (Signed)
Emergency Medicine Provider Triage Evaluation Note  Jenna Huber , a 35 y.o. female  was evaluated in triage.  Pt complains of complains of heart palpitations, shortness of breath, dizziness for several days.  Patient is insulin-dependent.  Was sent here by her PCP.  Review of Systems  Positive: Heart palpitations, shortness of breath, dizziness, nausea Negative: Denies chest pain, dysuria  Physical Exam  BP 137/77   Pulse 84   Temp 97.7 F (36.5 C) (Oral)   Resp 18   Ht 5\' 4"  (1.626 m)   Wt 55.8 kg   SpO2 97%   BMI 21.11 kg/m  Gen:   Awake, no distress   Resp:  Normal effort  MSK:   Moves extremities without difficulty  Other:  Neuro is intact,   Medical Decision Making  Medically screening exam initiated at 5:32 PM.  Appropriate orders placed.  JAINE ESTABROOKS was informed that the remainder of the evaluation will be completed by another provider, this initial triage assessment does not replace that evaluation, and the importance of remaining in the ED until their evaluation is complete.     Olivia Mackie, PA-C 09/19/20 1815    09/21/20, MD 09/19/20 [phone]

## 2020-09-19 NOTE — ED Triage Notes (Signed)
Patient reports heart palpitations, SOB, and dizziness x several days. Patient reports PCP sent her here to be seen. Patient also reports nausea, but denies vomiting or diarrhea.

## 2020-09-19 NOTE — ED Notes (Signed)
Patient given urine cup, notified of urine ordered.

## 2020-09-19 NOTE — H&P (Addendum)
NAME:  METTA KORANDA, MRN:  [id number], DOB:  1985-08-22, LOS: 0 ADMISSION DATE:  09/19/2020, CONSULTATION DATE:  09/19/2020 REFERRING MD:  Hulan Saas MD , CHIEF COMPLAINT: DKA    History of present illness   35 Y.O female with significant PMH of Anxiety and Depression, Seizures,  Asthma, Sciatica, Tobacco use, and newly diagnosed type 1 DM who presented to the ED with chief complaints of dizziness, heart palpitations, SOB and nausea x several days.  Patient state that she was recently started Metformin for her newly diagnosed DM type 1 but was unable to tolerate the medication due to side effects. She was switched to extended release Metformin and Insulin sq which she report taking until she was starting to run of her insulin supply. Patient state she decide to cut her insulin doses in half to avoid running out that is when her symptoms started. Denies other associated symptoms of chest pain, fevers or chills, cough, abdominal pain, or diarrhea.  ED: On arrival to the ED, he was afebrile with blood pressure 137/77 mm Hg and pulse rate 84 beats/min. There were no focal neurological deficits; he was alert and oriented x4, and he did not demonstrate any memory deficits.  The laboratory data showed an anion gap, metabolic acidosis, and hyperglycemia (pH of 7.12, anion gap of 22, bicarbonate 9.8 mmol/l, urinary ketones 32m/dl, glucose 259 mg/dl) consistent with the diagnosis of DKA. WBC/Hgb/Hct/Plts:  16.0/16.2/49.3/388 (07/20 1634). Lactate: 2.1Urinalysis demonstrated no evidence of infection. The patient's  last hemoglobin A1c (A1C) was 11.5%. The patient was treated aggressively with intravenous fluid and an insulin-glucose infusion. Other Labs/Diagnostics EKG: Sinus rhythm with a ventricular rate of 88, normal axis, unremarkable intervals with some nonspecific change versus artifact in inferior and lateral leads as well as anterior leads V2 and V3. CXR: shows no clear focal consolidation, large  effusion, significant edema, pneumothorax or other clear acute intrathoracic process  Patient was transferred to the ICU for correction of acidosis, slow stabilization of blood glucose levels, fluid resuscitation, electrolyte replacement, and close monitoring. PCCM consulted.  Past Medical History  Anxiety and Depression,  Seizures,   Asthma,  Sciatica,  Tobacco use,  Newly diagnosed type 1 DM   Significant Hospital Events   7/20: Admitted to stepdown with DKA  Consults:  PCCM  Procedures:  None  Significant Diagnostic Tests:  7/20: Chest Xray>shows no clear focal consolidation, large effusion, significant edema, pneumothorax or other clear acute intrathoracic process  Micro Data:  7/20: SARS-CoV-2 PCR>> negative 7/20: Influenza PCR>> negative Antimicrobials:  None  OBJECTIVE  Blood pressure (!) 118/100, pulse 84, temperature 97.7 F (36.5 C), temperature source Oral, resp. rate 17, height '5\' 4"'  (1.626 m), weight 55.8 kg, SpO2 100 %.        Intake/Output Summary (Last 24 hours) at 09/19/2020 1957 Last data filed at 09/19/2020 1915 Gross per 24 hour  Intake 1100 ml  Output --  Net 1100 ml   Filed Weights   09/19/20 1624  Weight: 55.8 kg   Physical Examination  GENERAL:AGE year-old critically ill patient lying in the bed with no acute distress.  EYES: Pupils equal, round, reactive to light and accommodation. No scleral icterus. Extraocular muscles intact.  HEENT: Head atraumatic, normocephalic. Oropharynx and nasopharynx clear.  NECK:  Supple, no jugular venous distention. No thyroid enlargement, no tenderness.  LUNGS: Normal breath sounds bilaterally, no wheezing, rales,rhonchi or crepitation. No use of accessory muscles of respiration.  CARDIOVASCULAR: S1, S2 normal. No murmurs, rubs, or gallops.  ABDOMEN: Soft, nontender, nondistended. Bowel sounds present. No organomegaly or mass.  EXTREMITIES: No pedal edema, cyanosis, or clubbing.  NEUROLOGIC: Cranial nerves  II through XII are intact.  Muscle strength 5/5 in all extremities. Sensation intact. Gait not checked.  PSYCHIATRIC: The patient is alert and oriented x 3.  SKIN: No obvious rash, lesion, or ulcer.   Labs/imaging that I havepersonally reviewed  (right click and "Reselect all SmartList Selections" daily)  ABG: Venous/Arterial Blood Gas result:  pO2 pending; pCO2 30; pH 7.12;  HCO3 9.8, %O2 Sat:33.3    Labs   CBC: Recent Labs  Lab 09/19/20 1634  WBC 16.0*  HGB 16.2*  HCT 49.3*  MCV 89.2  PLT 967    Basic Metabolic Panel: Recent Labs  Lab 09/19/20 1634 09/19/20 1749  NA 133*  --   K 4.1  --   CL 101  --   CO2 10*  --   GLUCOSE 259*  --   BUN 14  --   CREATININE 0.85  --   CALCIUM 9.5  --   MG  --  2.0   GFR: Estimated Creatinine Clearance: 82.1 mL/min (by C-G formula based on SCr of 0.85 mg/dL). Recent Labs  Lab 09/19/20 1634 09/19/20 1743  WBC 16.0*  --   LATICACIDVEN  --  2.1*    Liver Function Tests: Recent Labs  Lab 09/19/20 1733  AST 15  ALT 9  ALKPHOS 70  BILITOT 1.6*  PROT 8.3*  ALBUMIN 5.0   No results for input(s): LIPASE, AMYLASE in the last 168 hours. No results for input(s): AMMONIA in the last 168 hours.  ABG    Component Value Date/Time   HCO3 9.8 (L) 09/19/2020 1743   ACIDBASEDEF 18.2 (H) 09/19/2020 1743   O2SAT 33.3 09/19/2020 1743     Coagulation Profile: No results for input(s): INR, PROTIME in the last 168 hours.  Cardiac Enzymes: No results for input(s): CKTOTAL, CKMB, CKMBINDEX, TROPONINI in the last 168 hours.  HbA1C: Hgb A1c MFr Bld  Date/Time Value Ref Range Status  10/04/2019 02:49 PM 11.5 (H) 4.8 - 5.6 % Final    Comment:             Prediabetes: 5.7 - 6.4          Diabetes: >6.4          Glycemic control for adults with diabetes: <7.0     CBG: Recent Labs  Lab 09/19/20 1909  GLUCAP 170*    Review of Systems:   Review of Systems  Constitutional:  Negative for chills, diaphoresis, fever,  malaise/fatigue and weight loss.  HENT: Negative.  Negative for ear pain, hearing loss and tinnitus.   Eyes: Negative.   Respiratory:  Positive for shortness of breath. Negative for cough, hemoptysis, sputum production and wheezing.   Cardiovascular: Negative.   Gastrointestinal:  Positive for nausea. Negative for abdominal pain, constipation, diarrhea and vomiting.  Genitourinary:  Negative for dysuria, flank pain, frequency, hematuria and urgency.  Musculoskeletal:  Negative for neck pain.  Skin:  Negative for itching and rash.  Neurological:  Positive for dizziness and weakness.  Endo/Heme/Allergies:  Positive for polydipsia.  Psychiatric/Behavioral:  Positive for depression. The patient is nervous/anxious.    Past Medical History  She,  has a past medical history of Anxiety, Asthma, BRCA negative (10/2019), Depressive disorder, Family history of breast cancer, Increased risk of breast cancer (10/2019), Sciatica, and Seizure ([place]).   Surgical History    Past Surgical History:  Procedure  Laterality Date   WISDOM TOOTH EXTRACTION       Social History   reports that she has been smoking cigarettes. She has a 1.50 pack-year smoking history. She has never used smokeless tobacco. She reports previous alcohol use. She reports that she does not use drugs.   Family History   Her family history includes Breast cancer in her maternal grandmother; Cervical cancer in her maternal grandmother; Diabetes in her maternal grandmother; Ovarian cancer in her maternal grandmother.   Allergies Allergies  Allergen Reactions   Penicillins Rash, Hives and Other (See Comments)    Has patient had a PCN reaction causing immediate rash, facial/tongue/throat swelling, SOB or lightheadedness with hypotension: yes Has patient had a PCN reaction causing severe rash involving mucus membranes or skin necrosis: no Has patient had a PCN reaction that required hospitalization: no  Has patient had a PCN reaction  occurring within the last 10 years: no If all of the above answers are "NO", then may proceed with Cephalosporin use.    Hydrocodone Itching   Tramadol Other (See Comments)    intolerance    Hydrocodone-Acetaminophen Itching and Rash   Vicodin [Hydrocodone-Acetaminophen] Rash     Home Medications  Prior to Admission medications   Medication Sig Start Date End Date Taking? Authorizing Provider  citalopram (CELEXA) 40 MG tablet Take 40 mg by mouth daily. 01/12/20  Yes [provider]  LANTUS SOLOSTAR 100 UNIT/ML Solostar Pen Inject 20 unit subcutaneously once a day  . If FBG in the am is >130 increase by 2 unit for nighttime dose. 04/24/20  Yes [provider]  albuterol (PROVENTIL HFA;VENTOLIN HFA) 108 (90 Base) MCG/ACT inhaler Inhale 2 puffs into the lungs every 6 (six) hours as needed.    [provider]  B-D ULTRAFINE III SHORT PEN 31G X 8 MM MISC USE 1 ONCE DAILY FOR LANTUS INJECTIONS 04/25/20   [provider]  fluticasone (FLONASE) 50 MCG/ACT nasal spray Place 2 sprays into both nostrils daily. 08/15/20   [provider]  metFORMIN (GLUCOPHAGE-XR) 500 MG 24 hr tablet Take 1,000 mg by mouth 2 (two) times daily. 03/09/20   [provider]  pseudoephedrine (SUDAFED) 60 MG tablet Take 60 mg by mouth every 6 (six) hours as needed. 08/15/20   [provider]  Scheduled Meds:  Chlorhexidine Gluconate Cloth  6 each Topical Daily   citalopram  40 mg Oral Daily   enoxaparin (LOVENOX) injection  40 mg Subcutaneous Q24H   Continuous Infusions:  dextrose 5% lactated ringers 125 mL/hr at 09/19/20 1916   insulin 4.8 Units/hr (09/19/20 2009)   lactated ringers Stopped (09/19/20 1916)   PRN Meds:.albuterol, dextrose, docusate sodium, ondansetron (ZOFRAN) IV, polyethylene glycol  Assessment & Plan:   Diabetic Ketoacidosis likely due to non-compliance with insulin therapy Anion gap, metabolic acidosis (pH of 3.33, anion gap of 22,  bicarbonate 9.8 mmol/l, urinary ketones 47m/dl, glucose 259 mg/dl) PMHx: Newly Diagnosed DM type 1  Home meds: Metformin and Insulin - Monitor ABG/VBG to assess severity of acidemia # Trigger Assessment (CXR, UA, negative, Blood Cultures for infection if indicated) - Troponin, EKG  shows no ischemia - Will check Lipase for pancreatitis - Received Insulin (regular) 0.1u/kg (~10 units) IV x1 - Continue Insulin drip, DKA protocol - Keep NPO - Glucose: q1h to titrate insulin - Lab monitoring: q2-4h BMP+Phosphorus+pH (ABG/VBG)  - Continue  D5 1/2NS  - Goal to normalize anion gap  - When normalize anion gap and low stable insulin requirement  for 4 hours will start  long acting insulin (NPH) then stop drip 2-3 hours AFTER to bridge effect and start carb-controlled diet. - Diabetes coordinator consult  Polysubstance abuse Disorder UDS + Cannabinoid Current everyday smoker -Counseling provided -Nicotine patch  Anxiety and Depression -Continue Citalopram  Best practice:  Diet:  NPO Pain/Anxiety/Delirium protocol (if indicated): No VAP protocol (if indicated): Not indicated DVT prophylaxis: SCD GI prophylaxis: H2B Glucose control:  Insulin gtt Central venous access:  N/A Arterial line:  N/A Foley:  N/A Mobility:  bed rest  PT consulted: N/A Last date of multidisciplinary goals of care discussion [7/20] Code Status:  full code Disposition: stepdown   = Goals of Care = Code Status Order: FULL  Primary Emergency Contact: Wakeley,Angel Wishes to pursue full aggressive treatment and intervention options, including CPR and intubation.  Critical care time: 45 minutes     Rufina Falco, DNP, CCRN, FNP-C, AGACNP-BC Acute Care Nurse Practitioner  [facility] Pager: [phone] Christine at [facility]  .

## 2020-09-19 NOTE — Plan of Care (Signed)
  Problem: Education: Goal: Knowledge of General Education information will improve Description: Including pain rating scale, medication(s)/side effects and non-pharmacologic comfort measures Outcome: Progressing Note: Patient profile completed. Skin intact. No complaints of pain. Spouse is at bedside. Patient is in agreement with plan of care.

## 2020-09-20 ENCOUNTER — Encounter (INDEPENDENT_AMBULATORY_CARE_PROVIDER_SITE_OTHER): Payer: Self-pay

## 2020-09-20 LAB — BASIC METABOLIC PANEL
Anion gap: 9 (ref 5–15)
Anion gap: 16 — ABNORMAL HIGH (ref 5–15)
Anion gap: 9 (ref 5–15)
Anion gap: 5 (ref 5–15)
BUN: 9 mg/dL (ref 6–20)
BUN: 10 mg/dL (ref 6–20)
BUN: 10 mg/dL (ref 6–20)
BUN: 8 mg/dL (ref 6–20)
CO2: 15 mmol/L — ABNORMAL LOW (ref 22–32)
CO2: 13 mmol/L — ABNORMAL LOW (ref 22–32)
CO2: 20 mmol/L — ABNORMAL LOW (ref 22–32)
CO2: 22 mmol/L (ref 22–32)
Calcium: 8.8 mg/dL — ABNORMAL LOW (ref 8.9–10.3)
Calcium: 8.8 mg/dL — ABNORMAL LOW (ref 8.9–10.3)
Calcium: 8.5 mg/dL — ABNORMAL LOW (ref 8.9–10.3)
Calcium: 8.6 mg/dL — ABNORMAL LOW (ref 8.9–10.3)
Chloride: 110 mmol/L (ref 98–111)
Chloride: 106 mmol/L (ref 98–111)
Chloride: 108 mmol/L (ref 98–111)
Chloride: 107 mmol/L (ref 98–111)
Creatinine, Ser: 0.49 mg/dL (ref 0.44–1.00)
Creatinine, Ser: 0.62 mg/dL (ref 0.44–1.00)
Creatinine, Ser: 0.48 mg/dL (ref 0.44–1.00)
Creatinine, Ser: 0.68 mg/dL (ref 0.44–1.00)
GFR, Estimated: >60 mL/min (ref >60)
GFR, Estimated: >60 mL/min (ref >60)
GFR, Estimated: >60 mL/min (ref >60)
GFR, Estimated: >60 mL/min (ref >60)
Glucose, Bld: 94 mg/dL (ref 70–99)
Glucose, Bld: 149 mg/dL — ABNORMAL HIGH (ref 70–99)
Glucose, Bld: 139 mg/dL — ABNORMAL HIGH (ref 70–99)
Glucose, Bld: 213 mg/dL — ABNORMAL HIGH (ref 70–99)
Potassium: 3.7 mmol/L (ref 3.5–5.1)
Potassium: 3.9 mmol/L (ref 3.5–5.1)
Potassium: 3.4 mmol/L — ABNORMAL LOW (ref 3.5–5.1)
Potassium: 3.9 mmol/L (ref 3.5–5.1)
Sodium: 134 mmol/L — ABNORMAL LOW (ref 135–145)
Sodium: 135 mmol/L (ref 135–145)
Sodium: 137 mmol/L (ref 135–145)
Sodium: 134 mmol/L — ABNORMAL LOW (ref 135–145)

## 2020-09-20 LAB — CBC
HCT: 39 % (ref 36.0–46.0)
Hemoglobin: 13.2 g/dL (ref 12.0–15.0)
MCH: 29.3 pg (ref 26.0–34.0)
MCHC: 33.8 g/dL (ref 30.0–36.0)
MCV: 86.7 fL (ref 80.0–100.0)
Platelets: 302 10*3/uL (ref 150–400)
RBC: 4.5 MIL/uL (ref 3.87–5.11)
RDW: 12.9 % (ref 11.5–15.5)
WBC: 10.7 10*3/uL — ABNORMAL HIGH (ref 4.0–10.5)
nRBC: 0 % (ref 0.0–0.2)

## 2020-09-20 LAB — GLUCOSE, CAPILLARY
Glucose-Capillary: 102 mg/dL — ABNORMAL HIGH (ref 70–99)
Glucose-Capillary: 102 mg/dL — ABNORMAL HIGH (ref 70–99)
Glucose-Capillary: 103 mg/dL — ABNORMAL HIGH (ref 70–99)
Glucose-Capillary: 126 mg/dL — ABNORMAL HIGH (ref 70–99)
Glucose-Capillary: 129 mg/dL — ABNORMAL HIGH (ref 70–99)
Glucose-Capillary: 130 mg/dL — ABNORMAL HIGH (ref 70–99)
Glucose-Capillary: 135 mg/dL — ABNORMAL HIGH (ref 70–99)
Glucose-Capillary: 143 mg/dL — ABNORMAL HIGH (ref 70–99)
Glucose-Capillary: 148 mg/dL — ABNORMAL HIGH (ref 70–99)
Glucose-Capillary: 150 mg/dL — ABNORMAL HIGH (ref 70–99)
Glucose-Capillary: 151 mg/dL — ABNORMAL HIGH (ref 70–99)
Glucose-Capillary: 151 mg/dL — ABNORMAL HIGH (ref 70–99)
Glucose-Capillary: 151 mg/dL — ABNORMAL HIGH (ref 70–99)
Glucose-Capillary: 170 mg/dL — ABNORMAL HIGH (ref 70–99)
Glucose-Capillary: 198 mg/dL — ABNORMAL HIGH (ref 70–99)
Glucose-Capillary: 202 mg/dL — ABNORMAL HIGH (ref 70–99)

## 2020-09-20 LAB — MAGNESIUM: Magnesium: 1.6 mg/dL — ABNORMAL LOW (ref 1.7–2.4)

## 2020-09-20 LAB — PHOSPHORUS: Phosphorus: 2.7 mg/dL (ref 2.5–4.6)

## 2020-09-20 LAB — BETA-HYDROXYBUTYRIC ACID: Beta-Hydroxybutyric Acid: 1.32 mmol/L — ABNORMAL HIGH (ref 0.05–0.27)

## 2020-09-20 LAB — HIV ANTIBODY (ROUTINE TESTING W REFLEX): HIV Screen 4th Generation wRfx: NONREACTIVE

## 2020-09-20 MED ORDER — DEXTROSE 50 % IV SOLN: 0.0000 mL | INTRAVENOUS | Status: DC | PRN

## 2020-09-20 MED ORDER — POTASSIUM CHLORIDE 10 MEQ/100ML IV SOLN
10.0000 meq | INTRAVENOUS | Status: AC
  Administered 2020-09-20 (×2): 10 meq via INTRAVENOUS
  Filled 2020-09-20 (×2): qty 100

## 2020-09-20 MED ORDER — INSULIN GLARGINE 100 UNIT/ML ~~LOC~~ SOLN
12.0000 [IU] | Freq: Once | SUBCUTANEOUS | Status: AC
  Administered 2020-09-20: 12 [IU] via SUBCUTANEOUS
  Filled 2020-09-20 (×2): qty 0.12

## 2020-09-20 MED ORDER — ENSURE MAX PROTEIN PO LIQD
11.0000 [oz_av] | Freq: Two times a day (BID) | ORAL | Status: DC
  Administered 2020-09-20 – 2020-09-21 (×2): 11 [oz_av] via ORAL
  Filled 2020-09-20: qty 330

## 2020-09-20 MED ORDER — DEXTROSE IN LACTATED RINGERS 5 % IV SOLN: INTRAVENOUS | Status: AC

## 2020-09-20 MED ORDER — INSULIN ASPART 100 UNIT/ML IJ SOLN
0.0000 [IU] | INTRAMUSCULAR | Status: AC
  Administered 2020-09-20: 5 [IU] via SUBCUTANEOUS
  Filled 2020-09-20: qty 1

## 2020-09-20 MED ORDER — ADULT MULTIVITAMIN W/MINERALS CH
1.0000 | ORAL_TABLET | Freq: Every day | ORAL | Status: DC
  Administered 2020-09-21: 1 via ORAL
  Filled 2020-09-20: qty 1

## 2020-09-20 MED ORDER — MAGNESIUM SULFATE 2 GM/50ML IV SOLN
2.0000 g | Freq: Once | INTRAVENOUS | Status: AC
  Administered 2020-09-20: 2 g via INTRAVENOUS
  Filled 2020-09-20: qty 50

## 2020-09-20 MED ORDER — DEXTROSE IN LACTATED RINGERS 5 % IV SOLN: INTRAVENOUS | Status: DC

## 2020-09-20 MED ORDER — LACTATED RINGERS IV SOLN: INTRAVENOUS | Status: AC

## 2020-09-20 MED ORDER — INSULIN GLARGINE 100 UNIT/ML ~~LOC~~ SOLN
20.0000 [IU] | Freq: Every day | SUBCUTANEOUS | Status: DC
  Administered 2020-09-21: 20 [IU] via SUBCUTANEOUS
  Filled 2020-09-20: qty 0.2

## 2020-09-20 MED ORDER — INSULIN REGULAR(HUMAN) IN NACL 100-0.9 UT/100ML-% IV SOLN: INTRAVENOUS | Status: AC

## 2020-09-20 MED ORDER — INSULIN ASPART 100 UNIT/ML IJ SOLN: 0.0000 [IU] | Freq: Every day | INTRAMUSCULAR | Status: DC

## 2020-09-20 MED ORDER — INSULIN ASPART 100 UNIT/ML IJ SOLN: 0.0000 [IU] | Freq: Three times a day (TID) | INTRAMUSCULAR | Status: DC

## 2020-09-20 MED ORDER — LACTATED RINGERS IV SOLN: INTRAVENOUS | Status: DC

## 2020-09-20 NOTE — Progress Notes (Signed)
Inpatient Diabetes Program Recommendations  AACE/ADA: New Consensus Statement on Inpatient Glycemic Control (2015)  Target Ranges:  Prepandial:   less than 140 mg/dL      Peak postprandial:   less than 180 mg/dL (1-2 hours)      Critically ill patients:  140 - 180 mg/dL   Lab Results  Component Value Date   GLUCAP 129 (H) 09/20/2020   HGBA1C 11.5 (H) 10/04/2019    Review of Glycemic Control  Diabetes history: DM  Outpatient Diabetes medications: Lantus 38-40 units, Metformin 1000 mg bid Current orders for Inpatient glycemic control:  IV insulin transitioning to SQ insulin  Inpatient Diabetes Program Recommendations:    - c-peptide level to determine how much insulin pt makes to verify DM type 1  -  Lantus 20 units  -  Novolog 0-9 units tid + hs -  Novolog 5 units tid meal coverage tid if eating >50% of meals.  -  order diet  Spoke with pt and partner at bedside. Pt with type 1 but was treated like type 2 at [facility]. Is able to get insulin for $10. Just didn't get refill. Discussed purpose of basal, correction, and meal coverage insulin. Pt to check glucose before meals and at bedtime now at home. Discussed differences between type 1 and type 2. Discussed c-peptide level and A1c level. Discussed glucose and A1c goals.  Thanks,  Christena Deem RN, MSN, BC-ADM Inpatient Diabetes Coordinator Team Pager [phone] (8a-5p)

## 2020-09-20 NOTE — Progress Notes (Signed)
Notified Ouma NP about Metb values, orders were placed to begin subcutaneous insulin and replace IVF.

## 2020-09-20 NOTE — Plan of Care (Signed)
Pt off insulin gtt at 1600, A&O x 4, room air, ambulatory in room, no apin issue at this time, excellent PO intake.  Continue to monitor

## 2020-09-20 NOTE — Progress Notes (Signed)
   09/20/20 0700  Clinical Encounter Type  Visited With Patient  Visit Type Initial  Referral From Nurse  Consult/Referral To Chaplain  Spiritual Encounters  Spiritual Needs Literature  Chaplain Oleta Mouse attempted to do an AD education for ICU-17 Pt, Jenna Huber. Pt was asleep both times of visit. If pt wants or request AD please page on call Chaplain to further execute their request.

## 2020-09-20 NOTE — Progress Notes (Signed)
   09/20/20 1282  Clinical Encounter Type  Visited With Patient  Visit Type Initial  Referral From Nurse  Consult/Referral To Chaplain   Chaplain Kammy Klett provided spiritual care, reflective listening, and intentional presence. Chaplain completed AD education with PT, as requested per OR. Pt will notify her nurse to contact the on-call chaplain once the AD booklet is completed, to have it notarized. Chaplain told PT that a Chaplain is available if she ever wanted to talk.

## 2020-09-20 NOTE — Progress Notes (Signed)
Initial Nutrition Assessment  DOCUMENTATION CODES:   Not applicable  INTERVENTION:   Ensure Max protein supplement BID, each supplement provides 150kcal and 30g of protein.  MVI po daily   Pt at high refeed risk; recommend monitor potassium, magnesium and phosphorus labs daily until stable  NUTRITION DIAGNOSIS:   Unintentional weight loss related to chronic illness (uncontrolled DM) as evidenced by 17 percent weight loss in 6 months.  GOAL:   Patient will meet greater than or equal to 90% of their needs  MONITOR:   Labs, Weight trends, Skin, I & O's, Diet advancement, PO intake, Supplement acceptance  REASON FOR ASSESSMENT:   Malnutrition Screening Tool    ASSESSMENT:   35 y/o female with significant PMH of anxiety and depression, seizures,  asthma, sciatica, substance abuse and newly diagnosed type 1 DM who is admitted with DKA  Met with pt in room today. Pt reports good appetite and oral intake pta but reports that she has not eaten in 2 days as she has been NPO in hospital. Pt reports that she is starving today and wants to eat. Pt reports that despite eating, she has been loosing weight. Pt reports a 25-30lb recent weight loss. Per chart, pt has lost 25lbs(17%) in 6 months; this is significant. Pt reports that she has been eating protein bars at home. Pt is willing to drink protein supplements in hospital; RD will order. Pt is at high refeed risk. Discussed with pt the importance of adequate protein intake needed to preserve lean muscle. RD also dicussed a diabetic diet with pt. RD will plan to provide diabetes education prior to pt discharging.   Medications reviewed and include: celexa, lovenox, insulin, LRS w/ 5% dextrose '@150ml' /hr, insulin, KCl  Labs reviewed: K 3.4(L), P 2.7 wnl, Mg 1.6(L) Wbc- 10.7(H) Cbgs- 150, 130, 126, 129, 135 x 24 hrs AIC 11.5(H)- 8/3  NUTRITION - FOCUSED PHYSICAL EXAM:  Flowsheet Row Most Recent Value  Orbital Region No depletion  Upper  Arm Region No depletion  Thoracic and Lumbar Region No depletion  Buccal Region No depletion  Temple Region No depletion  Clavicle Bone Region Moderate depletion  Clavicle and Acromion Bone Region Moderate depletion  Scapular Bone Region Moderate depletion  Dorsal Hand No depletion  Patellar Region Moderate depletion  Anterior Thigh Region Moderate depletion  Posterior Calf Region Moderate depletion  Edema (RD Assessment) None  Hair Reviewed  Eyes Reviewed  Mouth Reviewed  Skin Reviewed  Nails Reviewed   Diet Order:   Diet Order             Diet Carb Modified Fluid consistency: Thin; Room service appropriate? Yes  Diet effective now                  EDUCATION NEEDS:   Education needs have been addressed  Skin:  Skin Assessment: Reviewed RN Assessment  Last BM:  7/20  Height:   Ht Readings from Last 1 Encounters:  09/19/20 '5\' 4"'  (1.626 m)    Weight:   Wt Readings from Last 1 Encounters:  09/20/20 55.6 kg    Ideal Body Weight:  54.5 kg  BMI:  Body mass index is 21.04 kg/m.  Estimated Nutritional Needs:   Kcal:  1700-1900kcal/day  Protein:  85-95g/day  Fluid:  1.7-2.0L/day  Koleen Distance MS, RD, LDN Please refer to [facility] for RD and/or RD on-call/weekend/after hours pager

## 2020-09-20 NOTE — Progress Notes (Signed)
PHARMACY CONSULT NOTE - FOLLOW UP  Pharmacy Consult for Electrolyte Monitoring and Replacement   Recent Labs: Potassium (mmol/L)  Date Value  09/20/2020 3.4 (L)  03/13/2014 3.8   Magnesium (mg/dL)  Date Value  15/17/6160 1.6 (L)   Calcium (mg/dL)  Date Value  73/71/0626 8.5 (L)   Calcium, Total (mg/dL)  Date Value  94/85/4627 8.9   Albumin (g/dL)  Date Value  03/50/0938 5.0  10/04/2019 4.7  03/13/2014 4.1   Phosphorus (mg/dL)  Date Value  18/29/9371 2.7   Sodium (mmol/L)  Date Value  09/20/2020 137  10/04/2019 140  03/13/2014 139     Assessment: 35 year old female with h/o diabetes presented in DKA. Has now transitioned to subcu insulin. Pharmacy consult for electrolyte management.  Goal of Therapy:  Electrolytes WNL  Plan:  --Patient has received IV potassium replacement --Mag 2 g IV given earlier --Now started on a diet --Electrolytes ordered with morning labs  Pricilla Riffle ,PharmD, BCPS Clinical Pharmacist 09/20/2020 2:35 PM

## 2020-09-21 ENCOUNTER — Other Ambulatory Visit: Payer: Self-pay

## 2020-09-21 DIAGNOSIS — E109 Type 1 diabetes mellitus without complications: Secondary | ICD-10-CM

## 2020-09-21 DIAGNOSIS — E1010 Type 1 diabetes mellitus with ketoacidosis without coma: Principal | ICD-10-CM

## 2020-09-21 LAB — BASIC METABOLIC PANEL
Anion gap: 6 (ref 5–15)
BUN: 13 mg/dL (ref 6–20)
CO2: 23 mmol/L (ref 22–32)
Calcium: 8.8 mg/dL — ABNORMAL LOW (ref 8.9–10.3)
Chloride: 108 mmol/L (ref 98–111)
Creatinine, Ser: 0.49 mg/dL (ref 0.44–1.00)
GFR, Estimated: >60 mL/min (ref >60)
Glucose, Bld: 123 mg/dL — ABNORMAL HIGH (ref 70–99)
Potassium: 3.6 mmol/L (ref 3.5–5.1)
Sodium: 137 mmol/L (ref 135–145)

## 2020-09-21 LAB — BLOOD GAS, VENOUS
Acid-base deficit: 18.2 mmol/L — ABNORMAL HIGH (ref 0.0–2.0)
Bicarbonate: 9.8 mmol/L — ABNORMAL LOW (ref 20.0–28.0)
O2 Saturation: 33.3 %
Patient temperature: 37
pCO2, Ven: 30 mmHg — ABNORMAL LOW (ref 44.0–60.0)
pH, Ven: 7.12 — CL (ref 7.250–7.430)

## 2020-09-21 LAB — CBC
HCT: 37.4 % (ref 36.0–46.0)
Hemoglobin: 13.2 g/dL (ref 12.0–15.0)
MCH: 29.9 pg (ref 26.0–34.0)
MCHC: 35.3 g/dL (ref 30.0–36.0)
MCV: 84.8 fL (ref 80.0–100.0)
Platelets: 284 10*3/uL (ref 150–400)
RBC: 4.41 MIL/uL (ref 3.87–5.11)
RDW: 13.1 % (ref 11.5–15.5)
WBC: 9.1 10*3/uL (ref 4.0–10.5)
nRBC: 0.2 % (ref 0.0–0.2)

## 2020-09-21 LAB — MAGNESIUM: Magnesium: 1.9 mg/dL (ref 1.7–2.4)

## 2020-09-21 LAB — GLUCOSE, CAPILLARY: Glucose-Capillary: 118 mg/dL — ABNORMAL HIGH (ref 70–99)

## 2020-09-21 LAB — PHOSPHORUS: Phosphorus: 3.2 mg/dL (ref 2.5–4.6)

## 2020-09-21 LAB — C-PEPTIDE: C-Peptide: 0.2 ng/mL — ABNORMAL LOW (ref 1.1–4.4)

## 2020-09-21 MED ORDER — BASAGLAR KWIKPEN 100 UNIT/ML ~~LOC~~ SOPN
20.0000 [IU] | PEN_INJECTOR | Freq: Every day | SUBCUTANEOUS | 5 refills | Status: AC
  Filled 2020-09-21: qty 15, 75d supply, fill #0

## 2020-09-21 MED ORDER — LANTUS SOLOSTAR 100 UNIT/ML ~~LOC~~ SOPN: 20.0000 [IU] | PEN_INJECTOR | Freq: Every day | SUBCUTANEOUS | 11 refills | Status: DC

## 2020-09-21 MED ORDER — ULTICARE SHORT PEN NEEDLES 31G X 8 MM MISC
5 refills | Status: AC
  Filled 2020-09-21: qty 100, 25d supply, fill #0

## 2020-09-21 MED ORDER — INSULIN ASPART 100 UNIT/ML FLEXPEN: 5.0000 [IU] | PEN_INJECTOR | Freq: Three times a day (TID) | SUBCUTANEOUS | 11 refills | Status: DC

## 2020-09-21 MED ORDER — INSULIN LISPRO (1 UNIT DIAL) 100 UNIT/ML (KWIKPEN)
5.0000 [IU] | PEN_INJECTOR | Freq: Three times a day (TID) | SUBCUTANEOUS | 11 refills | Status: AC
  Filled 2020-09-21: qty 15, 100d supply, fill #0

## 2020-09-21 NOTE — TOC Initial Note (Signed)
Transition of Care [facility]) - Initial/Assessment Note    Patient Details  Name: Jenna Huber MRN: [id number] Date of Birth: 1986/01/31  [facility]) CM/SW Contact:    Marina Goodell Phone Number: [phone] 09/21/2020, 8:50 AM  Clinical Narrative:                  Patient presents to [facility] due to heart palpitations, SOB and dizziness.  Patient is active with [facility]. Patient has history diabetes.  Patient's main contact is Denning,Angel (Spouse) [phone] [facility]).  Patient will discharge home with spouse. Patient will follow up with primary physician at [facility].  Expected Discharge Plan: Home/Self Care Barriers to Discharge: No Barriers Identified   Patient Goals and CMS Choice Patient states their goals for this hospitalization and ongoing recovery are:: To feel better and get home      Expected Discharge Plan and Services Expected Discharge Plan: Home/Self Care In-house Referral: Clinical Social Work     Living arrangements for the past 2 months: Single Family Home Expected Discharge Date: 09/21/20                                    Prior Living Arrangements/Services Living arrangements for the past 2 months: Single Family Home Lives with:: Spouse (Court,Angel (Spouse)   [phone] (Mobile)) Patient language and need for interpreter reviewed:: Yes Do you feel safe going back to the place where you live?: Yes      Need for Family Participation in Patient Care: No (Comment) Care giver support system in place?: Yes (comment)   Criminal Activity/Legal Involvement Pertinent to Current Situation/Hospitalization: No - Comment as needed  Activities of Daily Living Home Assistive Devices/Equipment: CBG Meter ADL Screening (condition at time of admission) Patient's cognitive ability adequate to safely complete daily activities?: Yes Is the patient deaf or have difficulty hearing?: No Does the patient have  difficulty seeing, even when wearing glasses/contacts?: No Does the patient have difficulty concentrating, remembering, or making decisions?: No Patient able to express need for assistance with ADLs?: Yes Does the patient have difficulty dressing or bathing?: No Independently performs ADLs?: Yes (appropriate for developmental age) Does the patient have difficulty walking or climbing stairs?: No Weakness of Legs: None Weakness of Arms/Hands: None  Permission Sought/Granted Permission sought to share information with : Facility Medical sales representative Permission granted to share information with : Yes, Verbal Permission Granted  Share Information with NAME: Warf,Angel (Spouse)   [phone] (Mobile)           Emotional Assessment Appearance:: Appears stated age Attitude/Demeanor/Rapport: Engaged Affect (typically observed): Stable Orientation: : Oriented to Self, Oriented to Place, Oriented to  Time, Oriented to Situation Alcohol / Substance Use: Not Applicable Psych Involvement: No (comment)  Admission diagnosis:  Diabetic ketoacidosis (HCC) [E11.10] Diabetic ketoacidosis without coma associated with type 1 diabetes mellitus (HCC) [E10.10] Patient Active Problem List   Diagnosis Date Noted   T1DM (type 1 diabetes mellitus) (HCC) 09/21/2020   Diabetic ketoacidosis (HCC) 09/19/2020   Dysmenorrhea 05/02/2020   Increased risk of breast cancer 05/02/2020   Elevated blood sugar 10/04/2019   Family history of breast cancer 10/04/2019   Migraine without aura and without status migrainosus, not intractable 08/14/2014   Seizure (HCC) 05/09/2014   PCP:  [facility] Pharmacy:   [facility] [address] [place] - [address] [place]  [address] Phone: [phone] Fax: [phone]  [facility] [address], [address] [place] [address] Phone: [phone] Fax:  [phone]     Social Determinants of Health (SDOH) Interventions    Readmission Risk Interventions No flowsheet data found.

## 2020-09-21 NOTE — Progress Notes (Signed)
Inpatient Diabetes Program Recommendations  AACE/ADA: New Consensus Statement on Inpatient Glycemic Control  Target Ranges:  Prepandial:   less than 140 mg/dL      Peak postprandial:   less than 180 mg/dL (1-2 hours)      Critically ill patients:  140 - 180 mg/dL  Results for Jenna Huber, Jenna Huber (MRN [id number]) as of 09/21/2020 07:16  Ref. Range 09/20/2020 20:30 09/20/2020 22:35 09/20/2020 23:20  Glucose-Capillary Latest Ref Range: 70 - 99 mg/dL 476 (H) 546 (H) 503 (H)   Results for Jenna Huber, Jenna Huber (MRN [id number]) as of 09/21/2020 07:16  Ref. Range 09/20/2020 07:39 09/20/2020 09:41 09/20/2020 10:29 09/20/2020 11:29 09/20/2020 12:29 09/20/2020 13:46 09/20/2020 14:33 09/20/2020 16:06  Glucose-Capillary Latest Ref Range: 70 - 99 mg/dL 517 (H) 001 (H) 749 (H) 129 (H) 135 (H) 151 (H) 198 (H) 103 (H)   Results for Jenna Huber, Jenna Huber (MRN [id number]) as of 09/21/2020 07:16  Ref. Range 09/20/2020 14:46  C-Peptide Latest Ref Range: 1.1 - 4.4 ng/mL 0.2 (L)   Results for Jenna Huber, Jenna Huber (MRN [id number]) as of 09/21/2020 07:16  Ref. Range 10/04/2019 14:49  Hemoglobin A1C Latest Ref Range: 4.8 - 5.6 % 11.5 (H)   Review of Glycemic Control  Diabetes history: DM1 Outpatient Diabetes medications: Lantus 38-40 units daily, Metformin XR 1000 mg BID Current orders for Inpatient glycemic control: Lantus 20 units daily, Novolog 0-9 units TID with meals, Novolog 0-5 units QHS  Inpatient Diabetes Program Recommendations:    Insulin: Please consider ordering Novolog 3 units TID with meals for meal coverage if patient eats at least 50% of meals.  Outpatient DM medications: Patient will need to be discharged on basal, correction, and meal coverage insulin. Would recommend discharging on similar insulin regimen as being used inpatient if glucose is controlled on regimen. Patient has no insurance and will need to get [facility] from [facility] (they have Basaglar and Humalog insulin pens). At  time of discharge please provide Rx for Basaglar [phone]) and Humalog insulin pens [phone]) and pen needles.  Thanks, Orlando Penner, RN, MSN, CDE Diabetes Coordinator Inpatient Diabetes Program [phone] (Team Pager from 8am to 5pm)

## 2020-09-21 NOTE — Discharge Summary (Signed)
Jenna Huber AJO:[id number] DOB: November 10, 1985 DOA: 09/19/2020  PCP: [facility]  Admit date: 09/19/2020 Discharge date: 09/21/2020  Time spent: 35  minutes  Recommendations for Outpatient Follow-up:  Close pcp f/u Endocrinology referral     Discharge Diagnoses:  Active Problems:   Diabetic ketoacidosis (HCC)   Discharge Condition: stable  Diet recommendation: carb modified  Filed Weights   09/19/20 1624 09/20/20 0500  Weight: 55.8 kg 55.6 kg    History of present illness:  35 Y.O female with significant PMH of Anxiety and Depression, Seizures,  Asthma, Sciatica, Tobacco use, and newly diagnosed type 1 DM who presented to the ED with chief complaints of dizziness, heart palpitations, SOB and nausea x several days.   Patient state that she was recently started Metformin for her newly diagnosed DM type 1 but was unable to tolerate the medication due to side effects. She was switched to extended release Metformin and Insulin sq which she report taking until she was starting to run of her insulin supply. Patient state she decide to cut her insulin doses in half to avoid running out that is when her symptoms started. Denies other associated symptoms of chest pain, fevers or chills, cough, abdominal pain, or diarrhea.  Hospital Course:  Presented in DKA. Rapid recovery. C peptide is low, this appears to be be t1dm. Will stop metformin at discharge. Adding mealtime insulin. Referring to endocrinology. Titration instructions for her long-acting and short-acting insulins provided.  Procedures: none   Consultations: none  Discharge Exam: Vitals:   09/20/20 2300 09/21/20 0510  BP: 100/72 (!) 96/59  Pulse: 62 (!) 54  Resp: 12 17  Temp:  98.1 F (36.7 C)  SpO2: 99% 99%    General: NAD Cardiovascular: RRR, no murmur Respiratory: CTAB Abdomen: soft, non-tender  Discharge Instructions   Discharge Instructions     Ambulatory referral to Endocrinology    Complete by: As directed    Diet Carb Modified   Complete by: As directed    Increase activity slowly   Complete by: As directed       Allergies as of 09/21/2020       Reactions   Penicillins Rash, Hives, Other (See Comments)   Has patient had a PCN reaction causing immediate rash, facial/tongue/throat swelling, SOB or lightheadedness with hypotension: yes Has patient had a PCN reaction causing severe rash involving mucus membranes or skin necrosis: no Has patient had a PCN reaction that required hospitalization: no  Has patient had a PCN reaction occurring within the last 10 years: no If all of the above answers are "NO", then may proceed with Cephalosporin use.   Hydrocodone Itching   Tramadol Other (See Comments)   intolerance   Hydrocodone-acetaminophen Itching, Rash   Vicodin [hydrocodone-acetaminophen] Rash        Medication List     STOP taking these medications    metFORMIN 500 MG 24 hr tablet Commonly known as: GLUCOPHAGE-XR   pseudoephedrine 60 MG tablet Commonly known as: SUDAFED       TAKE these medications    albuterol 108 (90 Base) MCG/ACT inhaler Commonly known as: VENTOLIN HFA Inhale 2 puffs into the lungs every 6 (six) hours as needed.   B-D ULTRAFINE III SHORT PEN 31G X 8 MM Misc Generic drug: Insulin Pen Needle USE 1 ONCE DAILY FOR LANTUS INJECTIONS   Basaglar KwikPen 100 UNIT/ML Inject 20 Units into the skin daily. What changed: See the new instructions.   citalopram 40 MG tablet Commonly  known as: CELEXA Take 40 mg by mouth daily.   fluticasone 50 MCG/ACT nasal spray Commonly known as: FLONASE Place 2 sprays into both nostrils daily.   insulin lispro 100 UNIT/ML KwikPen Commonly known as: HUMALOG Inject 5 Units into the skin 3 (three) times daily.       Allergies  Allergen Reactions   Penicillins Rash, Hives and Other (See Comments)    Has patient had a PCN reaction causing immediate rash, facial/tongue/throat swelling, SOB  or lightheadedness with hypotension: yes Has patient had a PCN reaction causing severe rash involving mucus membranes or skin necrosis: no Has patient had a PCN reaction that required hospitalization: no  Has patient had a PCN reaction occurring within the last 10 years: no If all of the above answers are "NO", then may proceed with Cephalosporin use.    Hydrocodone Itching   Tramadol Other (See Comments)    intolerance    Hydrocodone-Acetaminophen Itching and Rash   Vicodin [Hydrocodone-Acetaminophen] Rash    Follow-up Information     [facility]. Schedule an appointment as soon as possible for a visit.   Specialty: General Practice Contact information: [address] [place] [address] [phone]                  The results of significant diagnostics from this hospitalization (including imaging, microbiology, ancillary and laboratory) are listed below for reference.    Significant Diagnostic Studies: DG Chest 2 View  Result Date: 09/19/2020 CLINICAL DATA:  Chest pain and shortness of breath. dizziness x several days. Patient reports PCP sent her here to be seen. Patient also reports nausea, but denies vomiting or diarrhea. Hx of asthma, EXAM: CHEST - 2 VIEW COMPARISON:  Chest x-ray 07/11/2004 FINDINGS: The heart size and mediastinal contours are within normal limits. Question pericentimeter nodularity overlying the right upper lung zone. No pulmonary edema. No pleural effusion. No pneumothorax. No acute osseous abnormality. IMPRESSION: Question pericentimeter nodularity overlying the right upper lung zone. Finding may related to EKG leads. Correlate with physical exam. Electronically Signed   By: Tish Frederickson M.D.   On: 09/19/2020 17:18    Microbiology: Recent Results (from the past 240 hour(s))  Resp Panel by RT-PCR (Flu A&B, Covid) Urine, Clean Catch     Status: None   Collection Time: 09/19/20  5:44 PM   Specimen: Urine, Clean Catch;  Nasopharyngeal(NP) swabs in vial transport medium  Result Value Ref Range Status   SARS Coronavirus 2 by RT PCR NEGATIVE NEGATIVE Final    Comment: (NOTE) SARS-CoV-2 target nucleic acids are NOT DETECTED.  The SARS-CoV-2 RNA is generally detectable in upper respiratory specimens during the acute phase of infection. The lowest concentration of SARS-CoV-2 viral copies this assay can detect is 138 copies/mL. A negative result does not preclude SARS-Cov-2 infection and should not be used as the sole basis for treatment or other patient management decisions. A negative result may occur with  improper specimen collection/handling, submission of specimen other than nasopharyngeal swab, presence of viral mutation(s) within the areas targeted by this assay, and inadequate number of viral copies(<138 copies/mL). A negative result must be combined with clinical observations, patient history, and epidemiological information. The expected result is Negative.  Fact Sheet for Patients:  BloggerCourse.com  Fact Sheet for Healthcare Providers:  SeriousBroker.it  This test is no t yet approved or cleared by the [place] FDA and  has been authorized for detection and/or diagnosis of SARS-CoV-2 by FDA under an Emergency  Use Authorization (EUA). This EUA will remain  in effect (meaning this test can be used) for the duration of the COVID-19 declaration under Section 564(b)(1) of the Act, 21 U.S.C.section 360bbb-3(b)(1), unless the authorization is terminated  or revoked sooner.       Influenza A by PCR NEGATIVE NEGATIVE Final   Influenza B by PCR NEGATIVE NEGATIVE Final    Comment: (NOTE) The Xpert Xpress SARS-CoV-2/FLU/RSV plus assay is intended as an aid in the diagnosis of influenza from Nasopharyngeal swab specimens and should not be used as a sole basis for treatment. Nasal washings and aspirates are unacceptable for Xpert Xpress  SARS-CoV-2/FLU/RSV testing.  Fact Sheet for Patients: BloggerCourse.com  Fact Sheet for Healthcare Providers: SeriousBroker.it  This test is not yet approved or cleared by the [place] FDA and has been authorized for detection and/or diagnosis of SARS-CoV-2 by FDA under an Emergency Use Authorization (EUA). This EUA will remain in effect (meaning this test can be used) for the duration of the COVID-19 declaration under Section 564(b)(1) of the Act, 21 U.S.C. section 360bbb-3(b)(1), unless the authorization is terminated or revoked.  Performed at [facility], [address] [place], [place] [address]   MRSA Next Gen by PCR, Nasal     Status: None   Collection Time: 09/19/20  7:45 PM   Specimen: Nasal Mucosa; Nasal Swab  Result Value Ref Range Status   MRSA by PCR Next Gen NOT DETECTED NOT DETECTED Final    Comment: (NOTE) The GeneXpert MRSA Assay (FDA approved for NASAL specimens only), is one component of a comprehensive MRSA colonization surveillance program. It is not intended to diagnose MRSA infection nor to guide or monitor treatment for MRSA infections. Test performance is not FDA approved in patients less than 85 years old. Performed at [facility], [address] [place], [place] [address]      Labs: Basic Metabolic Panel: Recent Labs  Lab 09/19/20 1749 09/19/20 2251 09/20/20 0303 09/20/20 0704 09/20/20 1047 09/20/20 1446 09/21/20 0517  NA 134* 132* 134* 135 137 134* 137  K 4.5 3.6 3.7 3.9 3.4* 3.9 3.6  CL 101 108 110 106 108 107 108  CO2 11* 15* 15* 13* 20* 22 23  GLUCOSE 242* 171* 94 149* 139* 213* 123*  BUN 12 9 9 10 10 8 13   CREATININE 0.87 0.59 0.49 0.62 0.48 0.68 0.49  CALCIUM 9.8 8.9 8.8* 8.8* 8.5* 8.6* 8.8*  MG 2.0  --   --  1.6*  --   --  1.9  PHOS  --  1.7*  --  2.7  --   --  3.2   Liver Function Tests: Recent Labs  Lab 09/19/20 1733  AST 15  ALT 9  ALKPHOS  70  BILITOT 1.6*  PROT 8.3*  ALBUMIN 5.0   No results for input(s): LIPASE, AMYLASE in the last 168 hours. No results for input(s): AMMONIA in the last 168 hours. CBC: Recent Labs  Lab 09/19/20 1634 09/20/20 1047 09/21/20 0517  WBC 16.0* 10.7* 9.1  HGB 16.2* 13.2 13.2  HCT 49.3* 39.0 37.4  MCV 89.2 86.7 84.8  PLT 388 302 284   Cardiac Enzymes: No results for input(s): CKTOTAL, CKMB, CKMBINDEX, TROPONINI in the last 168 hours. BNP: BNP (last 3 results) No results for input(s): BNP in the last 8760 hours.  ProBNP (last 3 results) No results for input(s): PROBNP in the last 8760 hours.  CBG: Recent Labs  Lab 09/20/20 1606 09/20/20 2030 09/20/20 2235  09/20/20 2320 09/21/20 0736  GLUCAP 103* 202* 170* 151* 118*       Signed:  Silvano BilisNoah B Jeovany Huitron MD.  [facility] 09/21/2020, 7:55 AM

## 2020-09-21 NOTE — Discharge Instructions (Signed)
Check fasting sugar and sugar 1 hour after each meal.   Goal fasting sugar: 80-120. Adjust Lantus to meet this goal.  Goal 1 hour after eating: less than 180. Adjust Aspart to meet this goal.

## 2020-10-08 ENCOUNTER — Encounter: Payer: Self-pay | Admitting: Endocrinology

## 2021-03-21 ENCOUNTER — Encounter: Payer: Self-pay | Admitting: Emergency Medicine

## 2021-03-21 ENCOUNTER — Other Ambulatory Visit: Payer: Self-pay

## 2021-03-21 DIAGNOSIS — R42 Dizziness and giddiness: Secondary | ICD-10-CM | POA: Insufficient documentation

## 2021-03-21 DIAGNOSIS — Z5321 Procedure and treatment not carried out due to patient leaving prior to being seen by health care provider: Secondary | ICD-10-CM | POA: Insufficient documentation

## 2021-03-21 DIAGNOSIS — R002 Palpitations: Secondary | ICD-10-CM | POA: Insufficient documentation

## 2021-03-21 DIAGNOSIS — R260 Ataxic gait: Secondary | ICD-10-CM | POA: Insufficient documentation

## 2021-03-21 LAB — COMPREHENSIVE METABOLIC PANEL
ALT: 6 U/L (ref 0–44)
AST: 14 U/L — ABNORMAL LOW (ref 15–41)
Albumin: 4.5 g/dL (ref 3.5–5.0)
Alkaline Phosphatase: 57 U/L (ref 38–126)
Anion gap: 9 (ref 5–15)
BUN: 11 mg/dL (ref 6–20)
CO2: 25 mmol/L (ref 22–32)
Calcium: 9.6 mg/dL (ref 8.9–10.3)
Chloride: 100 mmol/L (ref 98–111)
Creatinine, Ser: 0.58 mg/dL (ref 0.44–1.00)
GFR, Estimated: >60 mL/min (ref >60)
Glucose, Bld: 179 mg/dL — ABNORMAL HIGH (ref 70–99)
Potassium: 4.2 mmol/L (ref 3.5–5.1)
Sodium: 134 mmol/L — ABNORMAL LOW (ref 135–145)
Total Bilirubin: 0.4 mg/dL (ref 0.3–1.2)
Total Protein: 7.6 g/dL (ref 6.5–8.1)

## 2021-03-21 LAB — URINALYSIS, ROUTINE W REFLEX MICROSCOPIC
Bilirubin Urine: NEGATIVE
Glucose, UA: 50 mg/dL — AB
Hgb urine dipstick: NEGATIVE
Ketones, ur: NEGATIVE mg/dL
Nitrite: NEGATIVE
Protein, ur: NEGATIVE mg/dL
Specific Gravity, Urine: 1.004 — ABNORMAL LOW (ref 1.005–1.030)
pH: 5 (ref 5.0–8.0)

## 2021-03-21 LAB — TROPONIN I (HIGH SENSITIVITY): Troponin I (High Sensitivity): 2 ng/L (ref <18)

## 2021-03-21 LAB — CBC
HCT: 42.9 % (ref 36.0–46.0)
Hemoglobin: 14.4 g/dL (ref 12.0–15.0)
MCH: 29.6 pg (ref 26.0–34.0)
MCHC: 33.6 g/dL (ref 30.0–36.0)
MCV: 88.3 fL (ref 80.0–100.0)
Platelets: 335 10*3/uL (ref 150–400)
RBC: 4.86 MIL/uL (ref 3.87–5.11)
RDW: 12.7 % (ref 11.5–15.5)
WBC: 14.5 10*3/uL — ABNORMAL HIGH (ref 4.0–10.5)
nRBC: 0 % (ref 0.0–0.2)

## 2021-03-21 LAB — CBG MONITORING, ED: Glucose-Capillary: 211 mg/dL — ABNORMAL HIGH (ref 70–99)

## 2021-03-21 LAB — POC URINE PREG, ED: Preg Test, Ur: NEGATIVE

## 2021-03-21 NOTE — ED Triage Notes (Signed)
Patient ambulatory to triage with steady gait, without difficulty or distress noted; pt reports last several days having "racing heart, dizziness"; st hx DKA

## 2021-03-22 ENCOUNTER — Emergency Department
Admission: EM | Admit: 2021-03-22 | Discharge: 2021-03-22 | Disposition: A | Discharge status: Home / Self Care | Payer: Self-pay | Attending: Emergency Medicine | Admitting: Emergency Medicine

## 2021-03-22 NOTE — ED Notes (Signed)
No answer when called several times from lobby 

## 2021-04-05 ENCOUNTER — Telehealth: Payer: Self-pay | Admitting: Pharmacist

## 2021-04-05 NOTE — Telephone Encounter (Signed)
Patient failed to provide requested 2023 financial documentation. No additional medication assistance will be provided by [facility] without the required proof of income documentation. Patient notified by letter. ? ?Jenna Huber ?[facility] ?

## 2021-04-09 ENCOUNTER — Telehealth: Payer: Self-pay | Admitting: Pharmacy Technician

## 2021-04-09 NOTE — Telephone Encounter (Signed)
°  [address] [place], [place]  [address]  April 05, 2021    Dear Magda Paganini:  This is to inform you that you are no longer eligible to receive medication assistance at [facility].  The reason(s) are:    _____Your total gross monthly household income exceeds 300% of the Federal Poverty Level.   _____Tangible assets (savings, checking, stocks/bonds, pension, retirement, etc.) exceeds our limit  _____You are eligible to receive benefits from [facility], [facility] or HIV Medication              Assistance Program _____You are eligible to receive benefits from a Medicare Part D plan _____You have prescription insurance  _____You are not an [facility] resident __X__Failure to provide all requested documentation (proof of income for 2023, and/or Patient Intake Application, DOH Attestation, Contract, etc).    Medication assistance will resume once all requested documentation has been returned to our clinic.  If you have questions, please contact our clinic at [phone].    Thank you,  [facility]

## 2021-04-16 ENCOUNTER — Other Ambulatory Visit: Payer: Self-pay

## 2021-08-15 ENCOUNTER — Encounter: Payer: Self-pay | Admitting: Obstetrics and Gynecology

## 2021-08-15 ENCOUNTER — Other Ambulatory Visit (HOSPITAL_COMMUNITY)
Admission: RE | Admit: 2021-08-15 | Discharge: 2021-08-15 | Disposition: A | Discharge status: Home / Self Care | Payer: BC Managed Care – PPO | Source: Ambulatory Visit | Attending: Obstetrics and Gynecology | Admitting: Obstetrics and Gynecology

## 2021-08-15 ENCOUNTER — Ambulatory Visit: Payer: 59 | Admitting: Obstetrics and Gynecology

## 2021-08-15 VITALS — BP 90/60 | Ht 64.0 in | Wt 156.0 lb

## 2021-08-15 DIAGNOSIS — R102 Pelvic and perineal pain: Secondary | ICD-10-CM | POA: Insufficient documentation

## 2021-08-15 DIAGNOSIS — Z113 Encounter for screening for infections with a predominantly sexual mode of transmission: Secondary | ICD-10-CM | POA: Diagnosis not present

## 2021-08-15 DIAGNOSIS — N898 Other specified noninflammatory disorders of vagina: Secondary | ICD-10-CM

## 2021-08-15 LAB — POCT WET PREP WITH KOH
Clue Cells Wet Prep HPF POC: NEGATIVE
KOH Prep POC: NEGATIVE
Trichomonas, UA: NEGATIVE
Yeast Wet Prep HPF POC: NEGATIVE

## 2021-08-15 NOTE — Progress Notes (Signed)
[facility]   Chief Complaint  Patient presents with   Pelvic Pain    Vag pressure, sharp vag pain at times x 3 months    HPI:      [name] is a 36 y.o. [id number] whose LMP was Patient's last menstrual period was 07/31/2021 (exact date)., presents today for pelvic pain that is constant/dull for over a yr. Pt had sx at 3/22 annual and GYN u/s was ordered but pt lost health insurance and u/s not done.  Pain is worse past few months, intermittent and sharp when not constant and dull; R>L but bilat overall. Feels like she has to have BM but doesn't. Hx of ovar cysts in past. Has had issues with watery diarrhea for the past month, with occas n/v. No fevers. Has noticed blood with wiping after BM occas but not in toilet/stool. Hasn't seen PCP for sx yet. Hx of constipation IBS in past. No urin sx.   She is sex active with female partner; pain/pressure with sex now. Has potential STD exposure about 2 yrs ago but no STD testing done. Does have frequent "yeast infections" with vaginal itching, sometimes d/c, no odor. Uses dial antibacterial soap vag and dryer sheets.   Menses are monthly, lasting 4-5 days, usually no BTB; had 1 episode bleeding this month a few days after menses stopped. Has severe dysmen   Patient Active Problem List   Diagnosis Date Noted   T1DM (type 1 diabetes mellitus) (HCC) 09/21/2020   Diabetic ketoacidosis (HCC) 09/19/2020   Dysmenorrhea 05/02/2020   Increased risk of breast cancer 05/02/2020   Elevated blood sugar 10/04/2019   Family history of breast cancer 10/04/2019   Migraine without aura and without status migrainosus, not intractable 08/14/2014   Seizure (HCC) 05/09/2014    Past Surgical History:  Procedure Laterality Date   WISDOM TOOTH EXTRACTION      Family History  Problem Relation Age of Onset   Ovarian cancer Maternal Grandmother        not sure of age   Breast cancer Maternal Grandmother        25s, with bilat  recurrence at a later time   Diabetes Maternal Grandmother    Cervical cancer Maternal Grandmother     Social History   Socioeconomic History   Marital status: Married    Spouse name: Not on file   Number of children: Not on file   Years of education: Not on file   Highest education level: Not on file  Occupational History   Not on file  Tobacco Use   Smoking status: Every Day    Packs/day: 0.50    Years: 3.00    Total pack years: 1.50    Types: Cigarettes   Smokeless tobacco: Never  Vaping Use   Vaping Use: Never used  Substance and Sexual Activity   Alcohol use: Not Currently    Comment: rarely   Drug use: No   Sexual activity: Yes    Birth control/protection: None  Other Topics Concern   Not on file  Social History Narrative   Not on file   Social Determinants of Health   Financial Resource Strain: Not on file  Food Insecurity: Not on file  Transportation Needs: Not on file  Physical Activity: Not on file  Stress: Not on file  Social Connections: Not on file  Intimate Partner Violence: Not on file    Outpatient Medications Prior to Visit  Medication Sig Dispense  Refill   albuterol (PROVENTIL HFA;VENTOLIN HFA) 108 (90 Base) MCG/ACT inhaler Inhale 2 puffs into the lungs every 6 (six) hours as needed.     fluticasone (FLONASE) 50 MCG/ACT nasal spray Place 2 sprays into both nostrils daily.     Insulin Glargine (BASAGLAR KWIKPEN) 100 UNIT/ML Inject 20 Units into the skin once daily. 15 mL 5   insulin lispro (HUMALOG) 100 UNIT/ML KwikPen Inject 5 Units into the skin 3 (three) times daily. 15 mL 11   Insulin Pen Needle (ULTICARE SHORT PEN NEEDLES) 31G X 8 MM MISC Use as directed with Basaglar and Humalog 100 each 5   citalopram (CELEXA) 40 MG tablet Take 40 mg by mouth daily.     No facility-administered medications prior to visit.      ROS:  Review of Systems  Constitutional:  Negative for fever.  Gastrointestinal:  Positive for diarrhea, nausea and  vomiting. Negative for blood in stool and constipation.  Genitourinary:  Positive for pelvic pain and vaginal discharge. Negative for dyspareunia, dysuria, flank pain, frequency, hematuria, urgency, vaginal bleeding and vaginal pain.  Musculoskeletal:  Negative for back pain.  Skin:  Negative for rash.    OBJECTIVE:   Vitals:  BP 90/60   Ht 5\' 4"  (1.626 m)   Wt 156 lb (70.8 kg)   LMP 07/31/2021 (Exact Date)   BMI 26.78 kg/m   Physical Exam Vitals reviewed.  Constitutional:      Appearance: She is well-developed.  Pulmonary:     Effort: Pulmonary effort is normal.  Abdominal:     Palpations: Abdomen is soft.     Tenderness: There is abdominal tenderness in the right lower quadrant, periumbilical area, suprapubic area and left lower quadrant. There is no guarding or rebound.  Genitourinary:    Pubic Area: No rash.      Labia:        Right: No rash, tenderness or lesion.        Left: No rash, tenderness or lesion.      Vagina: Vaginal discharge present. No erythema or tenderness.     Cervix: Cervical motion tenderness present.     Uterus: Normal. Tender. Not enlarged.      Adnexa:        Right: Tenderness present. No mass.         Left: Tenderness present. No mass.       Comments: LOOKS CHRONICALLY IRRITATED BILAT LABIA MAJORA/MINORA Musculoskeletal:        General: Normal range of motion.     Cervical back: Normal range of motion.  Skin:    General: Skin is warm and dry.  Neurological:     General: No focal deficit present.     Mental Status: She is alert and oriented to person, place, and time.  Psychiatric:        Mood and Affect: Mood normal.        Behavior: Behavior normal.        Thought Content: Thought content normal.        Judgment: Judgment normal.     Results: Results for orders placed or performed in visit on 08/15/21 (from the past 24 hour(s))  POCT Wet Prep with KOH     Status: Normal   Collection Time: 08/15/21  4:11 PM  Result Value Ref Range    Trichomonas, UA Negative    Clue Cells Wet Prep HPF POC neg    Epithelial Wet Prep HPF POC     Yeast Wet Prep  HPF POC neg    Bacteria Wet Prep HPF POC     RBC Wet Prep HPF POC     WBC Wet Prep HPF POC     KOH Prep POC Negative Negative     Assessment/Plan: Pelvic pain - Plan: Cervicovaginal ancillary only, US PELVIC COMPLETE WITH TRANSVAGINAL, tender on exam, sx over a yr but worse lately. Rule out STDs, check GYN u/s. If neg, most likely GI due to diarrhea for past month.   Screening for STD (sexually transmitted disease) - Plan: Cervicovaginal ancillary only  Vaginal itching - Plan: POCT Wet Prep with KOH; neg wet prep, pos ext sx. Change to dove sens skin soap, line dry underwear, OTC hydrocortisone crm prn. F/u prn.  Diarrhea--f/u with PCP for further eval/mgmt. Will see if pelvic pain sx improve with tx.     Return in about 2 weeks (around 08/29/2021) for GYN u/s for pelvic pain--[facility] to call with results.  Makenze Ellett B. Fraser Busche, PA-C 08/15/2021 4:11 PM

## 2021-08-18 LAB — CERVICOVAGINAL ANCILLARY ONLY
Chlamydia: NEGATIVE
Comment: NEGATIVE
Comment: NORMAL
Neisseria Gonorrhea: NEGATIVE

## 2021-08-27 ENCOUNTER — Other Ambulatory Visit: Payer: BC Managed Care – PPO

## 2021-08-27 ENCOUNTER — Ambulatory Visit
Admission: RE | Admit: 2021-08-27 | Discharge: 2021-08-27 | Disposition: A | Discharge status: Home / Self Care | Payer: 59 | Source: Ambulatory Visit | Attending: Obstetrics and Gynecology | Admitting: Obstetrics and Gynecology

## 2021-08-27 DIAGNOSIS — R102 Pelvic and perineal pain: Secondary | ICD-10-CM

## 2021-09-05 ENCOUNTER — Other Ambulatory Visit: Payer: BC Managed Care – PPO

## 2021-11-28 ENCOUNTER — Other Ambulatory Visit: Payer: Self-pay

## 2021-11-28 ENCOUNTER — Emergency Department
Admission: EM | Admit: 2021-11-28 | Discharge: 2021-11-28 | Disposition: A | Discharge status: Home / Self Care | Payer: 59 | Attending: Emergency Medicine | Admitting: Emergency Medicine

## 2021-11-28 DIAGNOSIS — E109 Type 1 diabetes mellitus without complications: Secondary | ICD-10-CM | POA: Diagnosis not present

## 2021-11-28 DIAGNOSIS — K0889 Other specified disorders of teeth and supporting structures: Secondary | ICD-10-CM | POA: Diagnosis present

## 2021-11-28 LAB — COMPREHENSIVE METABOLIC PANEL
ALT: 8 U/L (ref 0–44)
AST: 19 U/L (ref 15–41)
Albumin: 4 g/dL (ref 3.5–5.0)
Alkaline Phosphatase: 69 U/L (ref 38–126)
Anion gap: 7 (ref 5–15)
BUN: 9 mg/dL (ref 6–20)
CO2: 25 mmol/L (ref 22–32)
Calcium: 9.4 mg/dL (ref 8.9–10.3)
Chloride: 108 mmol/L (ref 98–111)
Creatinine, Ser: 0.68 mg/dL (ref 0.44–1.00)
GFR, Estimated: >60 mL/min (ref >60)
Glucose, Bld: 107 mg/dL — ABNORMAL HIGH (ref 70–99)
Potassium: 3.8 mmol/L (ref 3.5–5.1)
Sodium: 140 mmol/L (ref 135–145)
Total Bilirubin: 0.6 mg/dL (ref 0.3–1.2)
Total Protein: 6.9 g/dL (ref 6.5–8.1)

## 2021-11-28 LAB — URINALYSIS, ROUTINE W REFLEX MICROSCOPIC
Bilirubin Urine: NEGATIVE
Glucose, UA: 50 mg/dL — AB
Hgb urine dipstick: NEGATIVE
Ketones, ur: NEGATIVE mg/dL
Nitrite: NEGATIVE
Protein, ur: NEGATIVE mg/dL
Specific Gravity, Urine: 1.009 (ref 1.005–1.030)
pH: 6 (ref 5.0–8.0)

## 2021-11-28 LAB — CBC WITH DIFFERENTIAL/PLATELET
Abs Immature Granulocytes: 0.05 10*3/uL (ref 0.00–0.07)
Basophils Absolute: 0.1 10*3/uL (ref 0.0–0.1)
Basophils Relative: 1 %
Eosinophils Absolute: 0.5 10*3/uL (ref 0.0–0.5)
Eosinophils Relative: 4 %
HCT: 40.5 % (ref 36.0–46.0)
Hemoglobin: 13.2 g/dL (ref 12.0–15.0)
Immature Granulocytes: 0 %
Lymphocytes Relative: 33 %
Lymphs Abs: 4.2 10*3/uL — ABNORMAL HIGH (ref 0.7–4.0)
MCH: 29 pg (ref 26.0–34.0)
MCHC: 32.6 g/dL (ref 30.0–36.0)
MCV: 89 fL (ref 80.0–100.0)
Monocytes Absolute: 0.8 10*3/uL (ref 0.1–1.0)
Monocytes Relative: 6 %
Neutro Abs: 7.2 10*3/uL (ref 1.7–7.7)
Neutrophils Relative %: 56 %
Platelets: 332 10*3/uL (ref 150–400)
RBC: 4.55 MIL/uL (ref 3.87–5.11)
RDW: 13.2 % (ref 11.5–15.5)
WBC: 12.9 10*3/uL — ABNORMAL HIGH (ref 4.0–10.5)
nRBC: 0 % (ref 0.0–0.2)

## 2021-11-28 LAB — LACTIC ACID, PLASMA: Lactic Acid, Venous: 1 mmol/L (ref 0.5–1.9)

## 2021-11-28 MED ORDER — CLINDAMYCIN HCL 150 MG PO CAPS
450.0000 mg | ORAL_CAPSULE | Freq: Once | ORAL | Status: AC
  Administered 2021-11-28: 450 mg via ORAL
  Filled 2021-11-28: qty 3

## 2021-11-28 MED ORDER — CLINDAMYCIN HCL 150 MG PO CAPS: 450.0000 mg | ORAL_CAPSULE | Freq: Three times a day (TID) | ORAL | 0 refills | Status: AC

## 2021-11-28 NOTE — ED Provider Triage Note (Signed)
Emergency Medicine Provider Triage Evaluation Note  Jenna Huber , a 36 y.o. female  was evaluated in triage.  Patient has a history of type 1 diabetes, DKA and asthma.  Patient had a dental extraction last week and continues to experience persistent pain and some low-grade fever.  Patient states that she is prone to DKA when she has infections and became concerned.  No chest pain, chest tightness or abdominal pain.  No vomiting or increased work of breathing.  Review of Systems  Positive: Patient has dental pain. Negative: No chest pain or abdominal pain.  Physical Exam  BP 114/74   Pulse 65   Temp 98.9 F (37.2 C) (Oral)   Resp 20   Ht 5\' 4"  (1.626 m)   Wt 70.8 kg   SpO2 94%   BMI 26.78 kg/m  Gen:   Awake, no distress   Resp:  Normal effort  MSK:   Moves extremities without difficulty  Other:    Medical Decision Making  Medically screening exam initiated at 3:41 PM.  Appropriate orders placed.  Jenna Huber was informed that the remainder of the evaluation will be completed by another provider, this initial triage assessment does not replace that evaluation, and the importance of remaining in the ED until their evaluation is complete.     Jenna Huber, [place] 11/28/21 1542

## 2021-11-28 NOTE — ED Provider Notes (Signed)
[facility] Provider Note    Event Date/Time   First MD Initiated Contact with Patient 11/28/21 1736     (approximate)   History   Chief Complaint: Dental Problem   HPI  Jenna Huber is a 36 y.o. female with a past history of type 1 diabetes, seizure disorder who comes ED complaining of right upper jaw pain and swelling.  She reports that she had a decayed infected tooth in the right upper mouth which was extracted 2 days ago by dentistry.  She has been managing pain with Tylenol and ibuprofen, but has not been on any antibiotics.  Feels like swelling has not improved as expected.  She contacted her PCP by phone who told her to come to the ED for evaluation.  No trouble swallowing or breathing.  No headache or vision change.  No neck pain.  No fever.  She is continued monitoring and managing with insulin    Physical Exam   Triage Vital Signs: ED Triage Vitals  Enc Vitals Group     BP 11/28/21 1537 114/74     Pulse Rate 11/28/21 1537 65     Resp 11/28/21 1537 20     Temp 11/28/21 1537 98.9 F (37.2 C)     Temp Source 11/28/21 1537 Oral     SpO2 11/28/21 1537 94 %     Weight 11/28/21 1538 156 lb (70.8 kg)     Height 11/28/21 1538 5\' 4"  (1.626 m)     Head Circumference --      Peak Flow --      Pain Score 11/28/21 1538 6     Pain Loc --      Pain Edu? --      Excl. in [place]? --     Most recent vital signs: Vitals:   11/28/21 1537  BP: 114/74  Pulse: 65  Resp: 20  Temp: 98.9 F (37.2 C)  SpO2: 94%    General: Awake, no distress.  CV:  Good peripheral perfusion.  Resp:  Normal effort.  Abd:  No distention.  Other:  No neck swelling or tenderness.  No lymphadenopathy, submental space is soft. Intraoral exam shows moist mucosa.  Multiple areas of dental decay.  In the right upper jaw, area of recent extraction is identified with good granulation tissue, no dry socket.  No gingival swelling or purulent drainage.  No secondary space edema.   There is no sign of secondary cellulitis in the intraoral space or externally on the maxilla.  Floor mouth is soft, no tongue elevation.  Tonsils are symmetric and uvula is midline.   ED Results / Procedures / Treatments   Labs (all labs ordered are listed, but only abnormal results are displayed) Labs Reviewed  COMPREHENSIVE METABOLIC PANEL - Abnormal; Notable for the following components:      Result Value   Glucose, Bld 107 (*)    All other components within normal limits  CBC WITH DIFFERENTIAL/PLATELET - Abnormal; Notable for the following components:   WBC 12.9 (*)    Lymphs Abs 4.2 (*)    All other components within normal limits  LACTIC ACID, PLASMA  LACTIC ACID, PLASMA  URINALYSIS, ROUTINE W REFLEX MICROSCOPIC     EKG    RADIOLOGY    PROCEDURES:  Procedures   MEDICATIONS ORDERED IN ED: Medications  clindamycin (CLEOCIN) capsule 450 mg (has no administration in time range)     IMPRESSION / MDM / ASSESSMENT AND PLAN / ED  COURSE  I reviewed the triage vital signs and the nursing notes.                             Patient presents with continued dental pain and sensation of swelling after dental extraction 2 days ago.  I will see any signs of complication or infection but with patient's type 1 diabetes, she is at increased risk of infectious complications, abscess, Ludwick's angina, compartment syndrome, airway compromise, Lemierre's syndrome so I will start her on a course of antibiotics.  She is allergic to amoxicillin, will prescribe clindamycin.       FINAL CLINICAL IMPRESSION(S) / ED DIAGNOSES   Final diagnoses:  Pain, dental     Rx / DC Orders   ED Discharge Orders          Ordered    clindamycin (CLEOCIN) 150 MG capsule  3 times daily        11/28/21 1819             Note:  This document was prepared using Dragon voice recognition software and may include unintentional dictation errors.   Sharman Cheek, MD 11/28/21 [phone]

## 2021-11-28 NOTE — ED Triage Notes (Signed)
Pt arrives with c/o dental swelling and pain. Per pt, she had an infected tooth removed on Tuesday and the pain and swelling have not gone down. Per pt, she was not on antibiotics before tooth was removed. Pt is a type 1 diabetic. Pt denies abnormal bleeding.

## 2022-02-11 IMAGING — CR DG CHEST 2V
1 series · 2 of 2 positions shown · non-contrast
Comparison: Chest x-ray 07/11/2004

CLINICAL DATA: Chest pain and shortness of breath. dizziness x
several days. Patient reports PCP sent her here to be seen. Patient
also reports nausea, but denies vomiting or diarrhea. Hx of asthma,

EXAM:
CHEST - 2 VIEW

[Series 1: dg chest 2 view · 0.14mm/px · 2 of 2 slices shown]
[im 1/2]
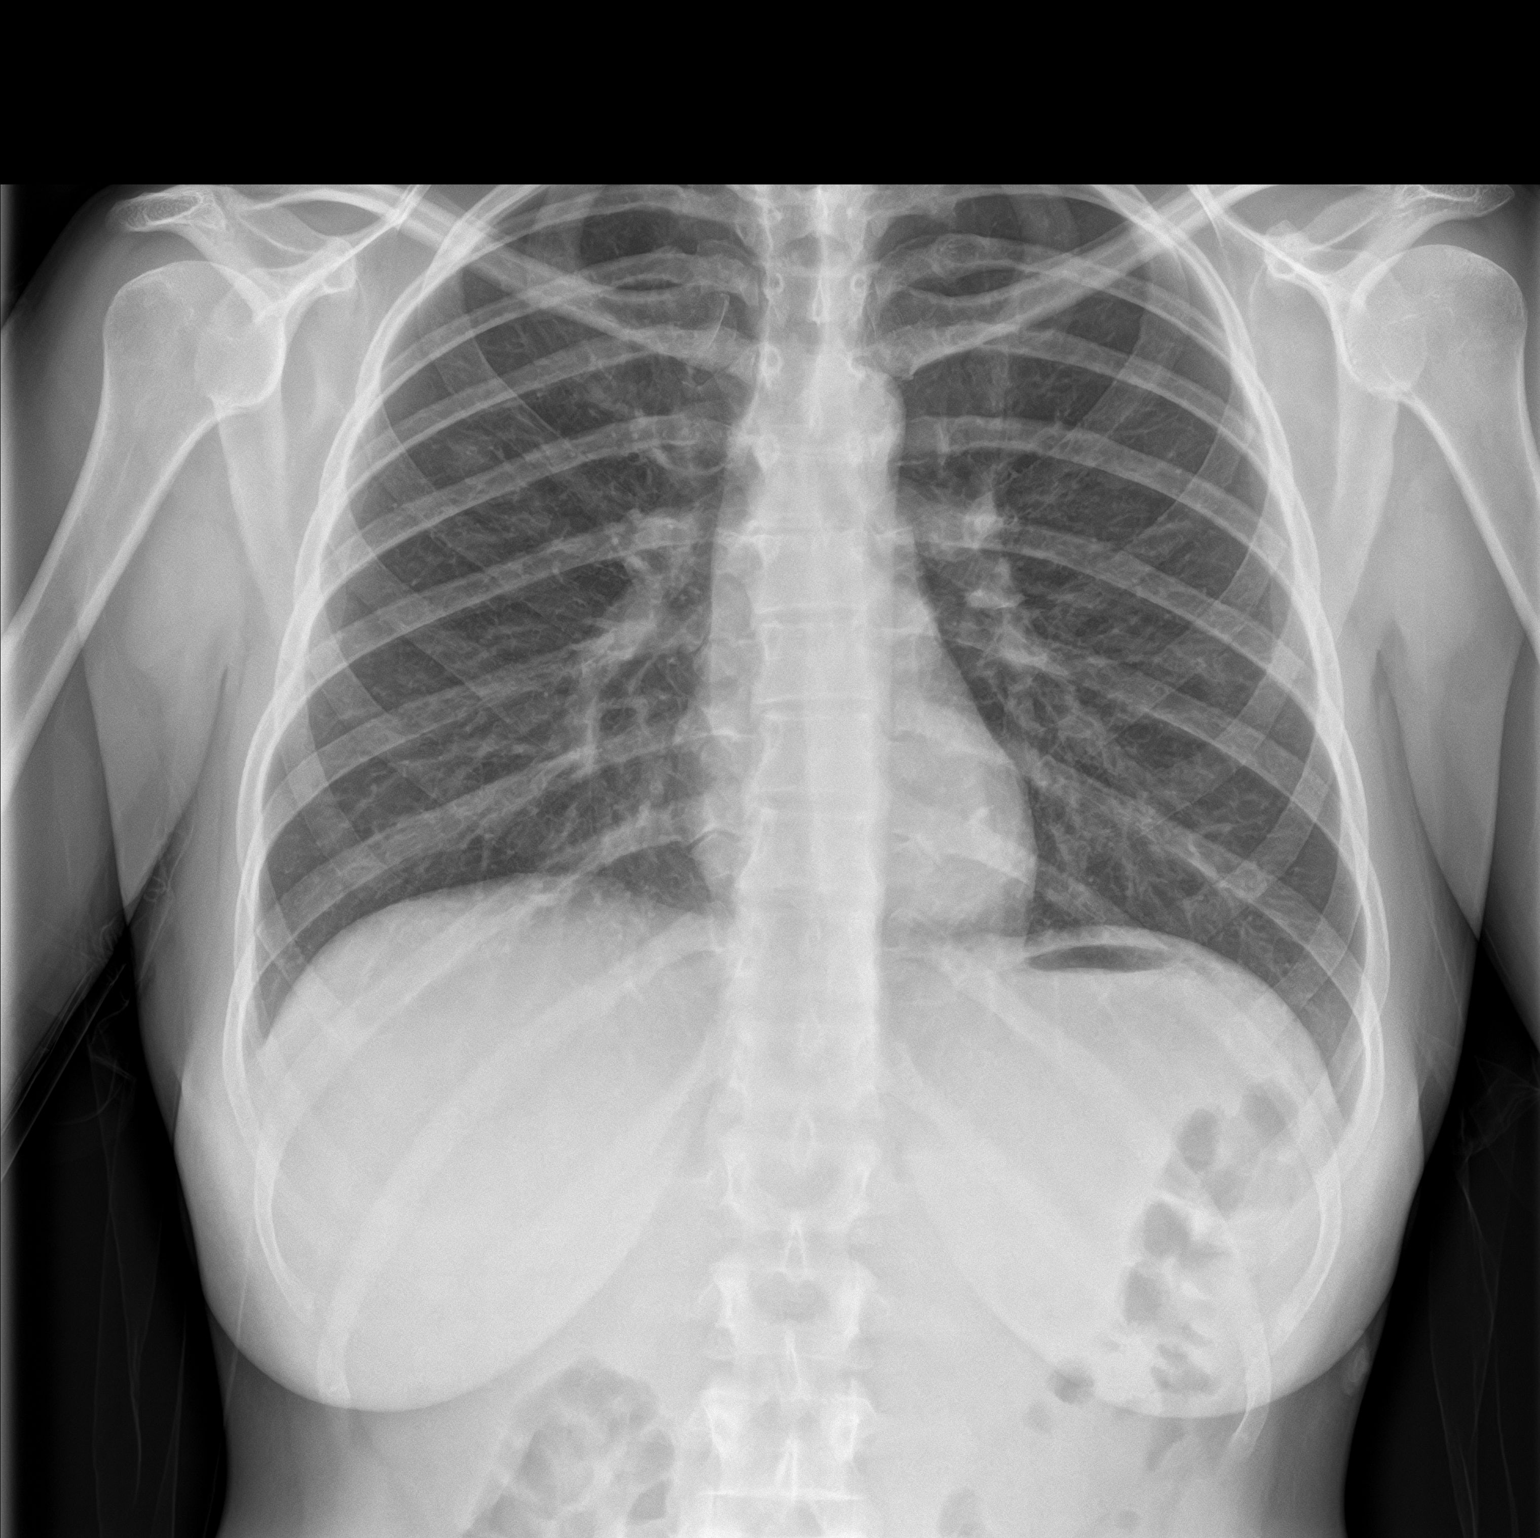
[im 2/2]
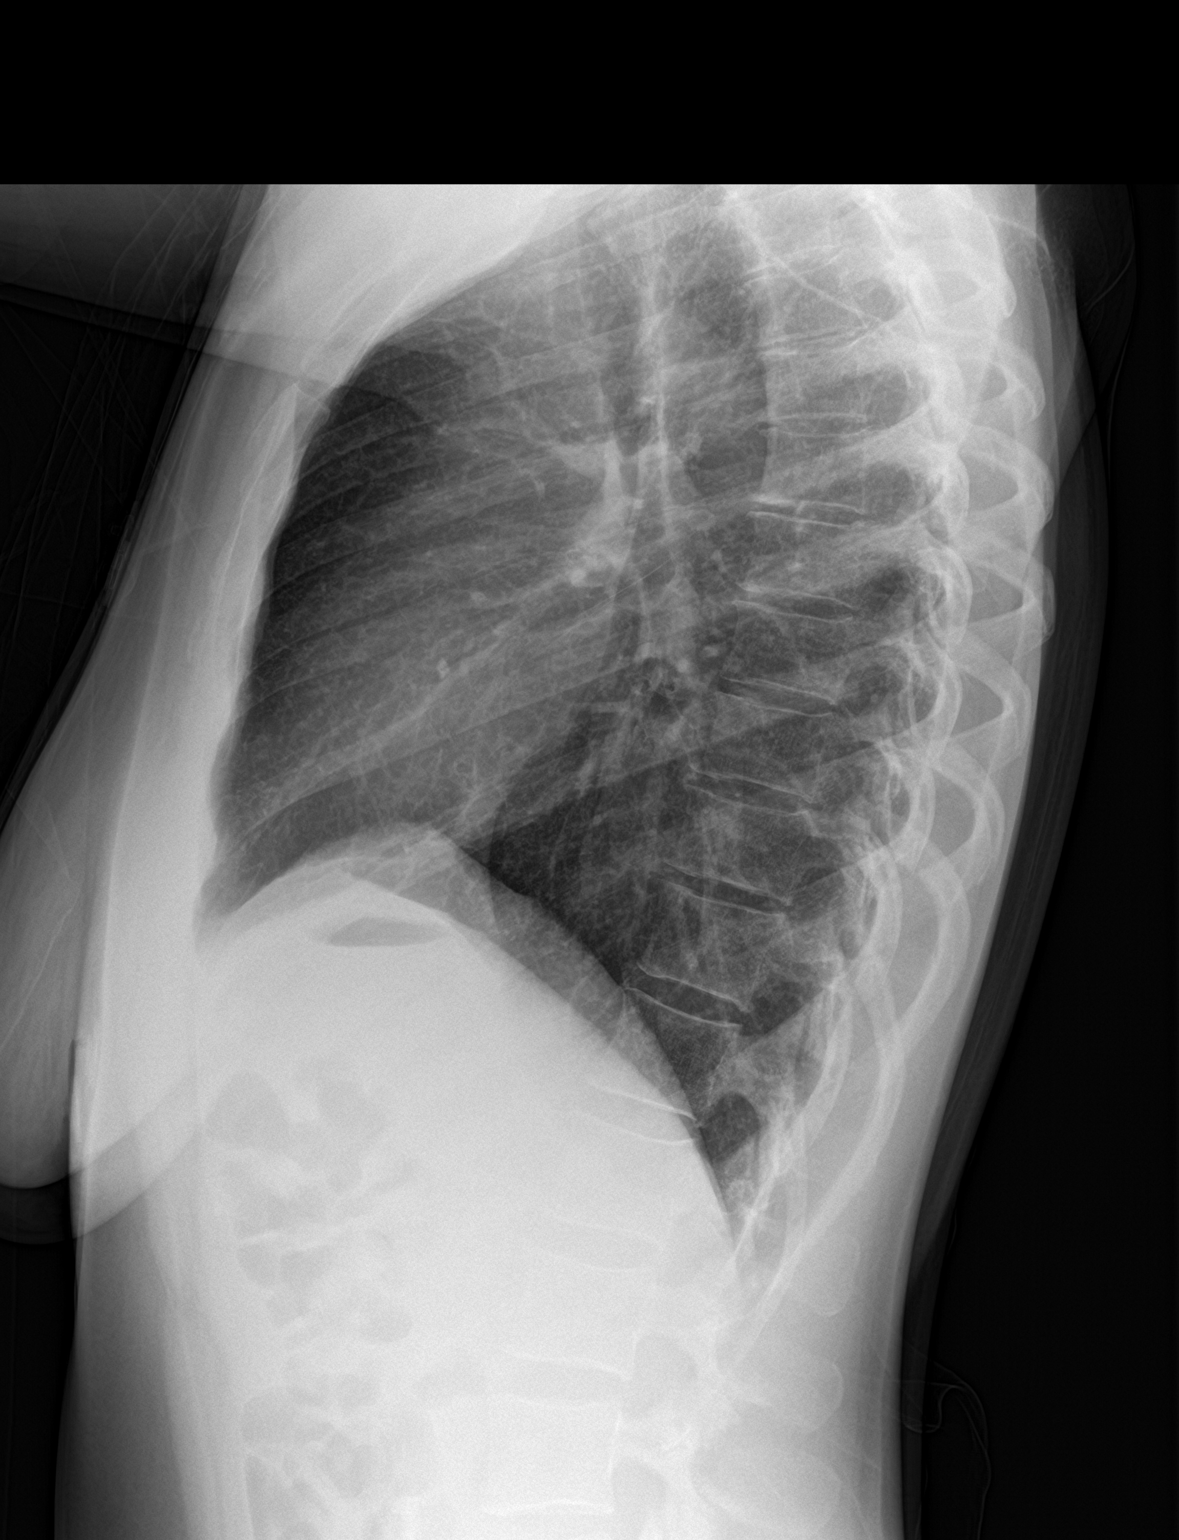

[2 of 2 positions shown; findings below may reference images not displayed]

FINDINGS: The heart size and mediastinal contours are within normal limits.

Question pericentimeter nodularity overlying the right upper lung
zone. No pulmonary edema. No pleural effusion. No pneumothorax.

No acute osseous abnormality.
IMPRESSION: Question pericentimeter nodularity overlying the right upper lung
zone. Finding may related to EKG leads. Correlate with physical
exam.

## 2022-07-17 ENCOUNTER — Emergency Department: Payer: Self-pay

## 2022-07-17 ENCOUNTER — Emergency Department
Admission: EM | Admit: 2022-07-17 | Discharge: 2022-07-17 | Disposition: A | Discharge status: Home / Self Care | Payer: Self-pay | Attending: Emergency Medicine | Admitting: Emergency Medicine

## 2022-07-17 ENCOUNTER — Encounter: Payer: Self-pay | Admitting: Emergency Medicine

## 2022-07-17 ENCOUNTER — Other Ambulatory Visit: Payer: Self-pay

## 2022-07-17 DIAGNOSIS — R109 Unspecified abdominal pain: Secondary | ICD-10-CM

## 2022-07-17 DIAGNOSIS — N83202 Unspecified ovarian cyst, left side: Secondary | ICD-10-CM

## 2022-07-17 DIAGNOSIS — R1031 Right lower quadrant pain: Secondary | ICD-10-CM | POA: Insufficient documentation

## 2022-07-17 DIAGNOSIS — E109 Type 1 diabetes mellitus without complications: Secondary | ICD-10-CM | POA: Insufficient documentation

## 2022-07-17 HISTORY — DX: Type 2 diabetes mellitus without complications: E11.9

## 2022-07-17 LAB — CBC
HCT: 42.6 % (ref 36.0–46.0)
Hemoglobin: 14.2 g/dL (ref 12.0–15.0)
MCH: 29.7 pg (ref 26.0–34.0)
MCHC: 33.3 g/dL (ref 30.0–36.0)
MCV: 89.1 fL (ref 80.0–100.0)
Platelets: 332 10*3/uL (ref 150–400)
RBC: 4.78 MIL/uL (ref 3.87–5.11)
RDW: 12.8 % (ref 11.5–15.5)
WBC: 14.2 10*3/uL — ABNORMAL HIGH (ref 4.0–10.5)
nRBC: 0 % (ref 0.0–0.2)

## 2022-07-17 LAB — URINALYSIS, ROUTINE W REFLEX MICROSCOPIC
Bilirubin Urine: NEGATIVE
Glucose, UA: >=500 mg/dL — AB
Hgb urine dipstick: NEGATIVE
Ketones, ur: NEGATIVE mg/dL
Nitrite: NEGATIVE
Protein, ur: NEGATIVE mg/dL
Specific Gravity, Urine: 1.011 (ref 1.005–1.030)
pH: 5 (ref 5.0–8.0)

## 2022-07-17 LAB — COMPREHENSIVE METABOLIC PANEL
ALT: 7 U/L (ref 0–44)
AST: 16 U/L (ref 15–41)
Albumin: 4.5 g/dL (ref 3.5–5.0)
Alkaline Phosphatase: 69 U/L (ref 38–126)
Anion gap: 8 (ref 5–15)
BUN: 16 mg/dL (ref 6–20)
CO2: 21 mmol/L — ABNORMAL LOW (ref 22–32)
Calcium: 9 mg/dL (ref 8.9–10.3)
Chloride: 105 mmol/L (ref 98–111)
Creatinine, Ser: 0.77 mg/dL (ref 0.44–1.00)
GFR, Estimated: >60 mL/min (ref >60)
Glucose, Bld: 287 mg/dL — ABNORMAL HIGH (ref 70–99)
Potassium: 4.3 mmol/L (ref 3.5–5.1)
Sodium: 134 mmol/L — ABNORMAL LOW (ref 135–145)
Total Bilirubin: 0.5 mg/dL (ref 0.3–1.2)
Total Protein: 7.4 g/dL (ref 6.5–8.1)

## 2022-07-17 LAB — POC URINE PREG, ED: Preg Test, Ur: NEGATIVE

## 2022-07-17 LAB — LIPASE, BLOOD: Lipase: 33 U/L (ref 11–51)

## 2022-07-17 MED ORDER — KETOROLAC TROMETHAMINE 15 MG/ML IJ SOLN
15.0000 mg | Freq: Once | INTRAMUSCULAR | Status: AC
  Administered 2022-07-17: 15 mg via INTRAVENOUS
  Filled 2022-07-17: qty 1

## 2022-07-17 MED ORDER — ONDANSETRON HCL 4 MG/2ML IJ SOLN
4.0000 mg | Freq: Once | INTRAMUSCULAR | Status: AC
  Administered 2022-07-17: 4 mg via INTRAVENOUS
  Filled 2022-07-17: qty 2

## 2022-07-17 MED ORDER — SULFAMETHOXAZOLE-TRIMETHOPRIM 800-160 MG PO TABS
1.0000 | ORAL_TABLET | Freq: Once | ORAL | Status: AC
  Administered 2022-07-17: 1 via ORAL
  Filled 2022-07-17: qty 1

## 2022-07-17 MED ORDER — SODIUM CHLORIDE 0.9 % IV BOLUS
500.0000 mL | Freq: Once | INTRAVENOUS | Status: AC
  Administered 2022-07-17: 500 mL via INTRAVENOUS

## 2022-07-17 MED ORDER — IOHEXOL 300 MG/ML  SOLN
100.0000 mL | Freq: Once | INTRAMUSCULAR | Status: AC | PRN
  Administered 2022-07-17: 100 mL via INTRAVENOUS

## 2022-07-17 MED ORDER — ACETAMINOPHEN 500 MG PO TABS
1000.0000 mg | ORAL_TABLET | Freq: Once | ORAL | Status: AC
  Administered 2022-07-17: 1000 mg via ORAL
  Filled 2022-07-17: qty 2

## 2022-07-17 MED ORDER — ONDANSETRON HCL 4 MG PO TABS: 4.0000 mg | ORAL_TABLET | Freq: Every day | ORAL | 1 refills | Status: AC | PRN

## 2022-07-17 MED ORDER — SULFAMETHOXAZOLE-TRIMETHOPRIM 800-160 MG PO TABS: 1.0000 | ORAL_TABLET | Freq: Two times a day (BID) | ORAL | 0 refills | Status: AC

## 2022-07-17 NOTE — ED Provider Notes (Signed)
[facility] Provider Note    Event Date/Time   First MD Initiated Contact with Patient 07/17/22 1116     (approximate)   History   No chief complaint on file.   HPI  Jenna Huber is a 37 y.o. female   Past medical history of type 1 diabetes, seizures, presents the emergency department right lower quadrant and right flank pain for the last several days.  Symptoms have actually been ongoing for several weeks but worsening over the last several days associate with some nausea but no vomiting, no diarrhea or GI bleeding.  No vaginal discharge or unusual bleeding.  Last period was several weeks ago.  She has no urinary symptoms.   She has no history of injuries or kidney stones and has never had pain like this before.  Independent Historian contributed to assessment above: Her spouse who is at bedside corroborates information given above.  External Medical Documents Reviewed: A pelvic ultrasound from June 2023 which was normal no cyst or adnexal masses      Physical Exam   Triage Vital Signs: ED Triage Vitals  Enc Vitals Group     BP 07/17/22 1055 119/73     Pulse Rate 07/17/22 1055 70     Resp 07/17/22 1055 16     Temp 07/17/22 1055 98.4 F (36.9 C)     Temp Source 07/17/22 1055 Oral     SpO2 07/17/22 1055 98 %     Weight 07/17/22 1051 156 lb 1.4 oz (70.8 kg)     Height 07/17/22 1051 5\' 4"  (1.626 m)     Head Circumference --      Peak Flow --      Pain Score 07/17/22 1051 8     Pain Loc --      Pain Edu? --      Excl. in [place]? --     Most recent vital signs: Vitals:   07/17/22 1055  BP: 119/73  Pulse: 70  Resp: 16  Temp: 98.4 F (36.9 C)  SpO2: 98%    General: Awake, no distress.  CV:  Good peripheral perfusion.  Resp:  Normal effort.  Abd:  No distention.  Other:  Mild right lower quadrant tenderness to palpation and CVA tenderness on the right side.  Otherwise the abdominal exam shows no rigidity or guarding.  Hemodynamics  appropriate reassuring and she is afebrile.  She defers pelvic exam.   ED Results / Procedures / Treatments   Labs (all labs ordered are listed, but only abnormal results are displayed) Labs Reviewed  COMPREHENSIVE METABOLIC PANEL - Abnormal; Notable for the following components:      Result Value   Sodium 134 (*)    CO2 21 (*)    Glucose, Bld 287 (*)    All other components within normal limits  CBC - Abnormal; Notable for the following components:   WBC 14.2 (*)    All other components within normal limits  URINALYSIS, ROUTINE W REFLEX MICROSCOPIC - Abnormal; Notable for the following components:   Color, Urine STRAW (*)    APPearance HAZY (*)    Glucose, UA >=500 (*)    Leukocytes,Ua TRACE (*)    Bacteria, UA MANY (*)    All other components within normal limits  LIPASE, BLOOD  POC URINE PREG, ED     I ordered and reviewed the above labs they are notable for his white blood cell count elevated at 14.  She has many  bacteria in her urine.    RADIOLOGY I independently reviewed and interpreted CT of the abdomen pelvis see no obvious obstructive or inflammatory changes.   PROCEDURES:  Critical Care performed: No  Procedures   MEDICATIONS ORDERED IN ED: Medications  ketorolac (TORADOL) 15 MG/ML injection 15 mg (15 mg Intravenous Given 07/17/22 1157)  acetaminophen (TYLENOL) tablet 1,000 mg (1,000 mg Oral Given 07/17/22 1157)  ondansetron (ZOFRAN) injection 4 mg (4 mg Intravenous Given 07/17/22 1158)  sodium chloride 0.9 % bolus 500 mL (500 mLs Intravenous New Bag/Given 07/17/22 1156)  iohexol (OMNIPAQUE) 300 MG/ML solution 100 mL (100 mLs Intravenous Contrast Given 07/17/22 1206)    IMPRESSION / MDM / ASSESSMENT AND PLAN / ED COURSE  I reviewed the triage vital signs and the nursing notes.                                Patient's presentation is most consistent with acute presentation with potential threat to life or bodily function.  Differential diagnosis  includes, but is not limited to, appendicitis, diverticulitis, ovarian torsion, ovarian cyst, menstrual cramping, urinary tract infection, renal colic/ureteral stone, pyelonephritis   The patient is on the cardiac monitor to evaluate for evidence of arrhythmia and/or significant heart rate changes.  MDM: Broad differential diagnoses above best evaluated with CT scan of the abdomen pelvis to start.  Negative for appendicitis but does show a rim-enhancing lesion in the left ovary which I will evaluate better with transvaginal ultrasound.  She does have bacteria in the urine this well could be due to urinary tract infection.  Transvaginal ultrasound is pending, if there are no emergent findings plan will be for outpatient prescription for urinary tract infection and she will follow-up with her primary doctor/gynecologist and return to the emergency department if any new or worsening symptoms.  I considered hospitalization/observation but she is very well-appearing with normal hemodynamics and so I think outpatient therapy, monitoring, and close follow-up is appropriate in the light of unremarkable/nonemergent findings on evaluation today.         FINAL CLINICAL IMPRESSION(S) / ED DIAGNOSES   Final diagnoses:  Right flank pain  Right lower quadrant pain     Rx / DC Orders   ED Discharge Orders          Ordered    ondansetron (ZOFRAN) 4 MG tablet  Daily PRN        07/17/22 1250    sulfamethoxazole-trimethoprim (BACTRIM DS) 800-160 MG tablet  2 times daily        07/17/22 1251             Note:  This document was prepared using Dragon voice recognition software and may include unintentional dictation errors.    Pilar Jarvis, MD 07/17/22 [phone]

## 2022-07-17 NOTE — ED Triage Notes (Signed)
Arrives with C/O RLQ and Right lower abd pain.  SEnt to ED from PCP to R/O appendicitis.  Also c/o dizziness, nausea, headache since Tuesday.  AAOx3.  Skin warm and dry. NAD

## 2022-07-17 NOTE — Discharge Instructions (Addendum)
Take bactrim antibiotic for the full course as prescribed.   Take acetaminophen 650 mg and ibuprofen 400 mg every 6 hours for pain.  Take with food.  See your regular doctor or gynecologist for follow-up appointment this week.  If you have any new, worsening, unexpected symptoms come back to the emergency department for reevaluation.

## 2023-03-14 ENCOUNTER — Other Ambulatory Visit: Payer: Self-pay

## 2023-03-14 ENCOUNTER — Emergency Department
Admission: EM | Admit: 2023-03-14 | Discharge: 2023-03-14 | Discharge status: Against Medical Advice | Payer: Self-pay | Attending: Emergency Medicine | Admitting: Emergency Medicine

## 2023-03-14 DIAGNOSIS — Z794 Long term (current) use of insulin: Secondary | ICD-10-CM | POA: Insufficient documentation

## 2023-03-14 DIAGNOSIS — R109 Unspecified abdominal pain: Secondary | ICD-10-CM | POA: Insufficient documentation

## 2023-03-14 DIAGNOSIS — Z5321 Procedure and treatment not carried out due to patient leaving prior to being seen by health care provider: Secondary | ICD-10-CM | POA: Insufficient documentation

## 2023-03-14 DIAGNOSIS — R739 Hyperglycemia, unspecified: Secondary | ICD-10-CM | POA: Insufficient documentation

## 2023-03-14 LAB — CBC WITH DIFFERENTIAL/PLATELET
Abs Immature Granulocytes: 0.08 10*3/uL — ABNORMAL HIGH (ref 0.00–0.07)
Basophils Absolute: 0.1 10*3/uL (ref 0.0–0.1)
Basophils Relative: 1 %
Eosinophils Absolute: 0.7 10*3/uL — ABNORMAL HIGH (ref 0.0–0.5)
Eosinophils Relative: 5 %
HCT: 39.9 % (ref 36.0–46.0)
Hemoglobin: 13.6 g/dL (ref 12.0–15.0)
Immature Granulocytes: 1 %
Lymphocytes Relative: 24 %
Lymphs Abs: 3.9 10*3/uL (ref 0.7–4.0)
MCH: 29.6 pg (ref 26.0–34.0)
MCHC: 34.1 g/dL (ref 30.0–36.0)
MCV: 86.9 fL (ref 80.0–100.0)
Monocytes Absolute: 0.7 10*3/uL (ref 0.1–1.0)
Monocytes Relative: 4 %
Neutro Abs: 10.9 10*3/uL — ABNORMAL HIGH (ref 1.7–7.7)
Neutrophils Relative %: 65 %
Platelets: 342 10*3/uL (ref 150–400)
RBC: 4.59 MIL/uL (ref 3.87–5.11)
RDW: 13.3 % (ref 11.5–15.5)
WBC: 16.4 10*3/uL — ABNORMAL HIGH (ref 4.0–10.5)
nRBC: 0 % (ref 0.0–0.2)

## 2023-03-14 LAB — COMPREHENSIVE METABOLIC PANEL
ALT: 8 U/L (ref 0–44)
AST: 20 U/L (ref 15–41)
Albumin: 3.8 g/dL (ref 3.5–5.0)
Alkaline Phosphatase: 68 U/L (ref 38–126)
Anion gap: 9 (ref 5–15)
BUN: 12 mg/dL (ref 6–20)
CO2: 21 mmol/L — ABNORMAL LOW (ref 22–32)
Calcium: 9 mg/dL (ref 8.9–10.3)
Chloride: 104 mmol/L (ref 98–111)
Creatinine, Ser: 0.73 mg/dL (ref 0.44–1.00)
GFR, Estimated: >60 mL/min (ref >60)
Glucose, Bld: 247 mg/dL — ABNORMAL HIGH (ref 70–99)
Potassium: 3.8 mmol/L (ref 3.5–5.1)
Sodium: 134 mmol/L — ABNORMAL LOW (ref 135–145)
Total Bilirubin: 0.4 mg/dL (ref 0.0–1.2)
Total Protein: 6.7 g/dL (ref 6.5–8.1)

## 2023-03-14 LAB — URINALYSIS, ROUTINE W REFLEX MICROSCOPIC
Bilirubin Urine: NEGATIVE
Glucose, UA: >=500 mg/dL — AB
Hgb urine dipstick: NEGATIVE
Ketones, ur: 20 mg/dL — AB
Leukocytes,Ua: NEGATIVE
Nitrite: NEGATIVE
Protein, ur: NEGATIVE mg/dL
Specific Gravity, Urine: 1.025 (ref 1.005–1.030)
pH: 5 (ref 5.0–8.0)

## 2023-03-14 LAB — POC URINE PREG, ED: Preg Test, Ur: NEGATIVE

## 2023-03-14 LAB — CBG MONITORING, ED: Glucose-Capillary: 244 mg/dL — ABNORMAL HIGH (ref 70–99)

## 2023-03-14 NOTE — ED Notes (Signed)
Pt called for repeat VS, no answer. 

## 2023-03-14 NOTE — ED Notes (Signed)
 Called pt  2xs no answer

## 2023-03-14 NOTE — ED Provider Triage Note (Signed)
 Emergency Medicine Provider Triage Evaluation Note  Jenna Huber , a 38 y.o. female  was evaluated in triage.  Pt complains of elevated blood sugar. She has been out of her long acting insulin  for 2 weeks. PCP nurse advised her to come to the ER to make sure she's not in DKA. Complaining of polyuria, polydypsia.  Physical Exam  There were no vitals taken for this visit. Gen:   Awake, no distress   Resp:  Normal effort  MSK:   Moves extremities without difficulty Other:    Medical Decision Making  Medically screening exam initiated at 2:35 PM.  Appropriate orders placed.  Jenna Huber was informed that the remainder of the evaluation will be completed by another provider, this initial triage assessment does not replace that evaluation, and the importance of remaining in the ED until their evaluation is complete.  Labs sent.   Jenna Kirk NOVAK, FNP 03/14/23 1436

## 2023-03-14 NOTE — ED Triage Notes (Signed)
 Pt states she has been without her long acting insulin  for 2 weeks. This morning her blood sugar was 425. She is on her last pen of her fast acting. Pt complains of abdominal pain 8/10 when she moves certain ways. She was told by an after hours nurse to come be seen to make sure that she is not in DKA. Pt alert and oriented x4 with no signs of distress at this time.

## 2023-03-14 NOTE — ED Notes (Signed)
 Called pt x2 to reassess vitals. No answer.
